# Patient Record
Sex: Male | Born: 1950
Health system: Southern US, Community
[De-identification: ages and names within clinical notes are randomized; demographics above are authoritative.]

## PROBLEM LIST (undated history)

## (undated) DIAGNOSIS — E8881 Metabolic syndrome: Secondary | ICD-10-CM

## (undated) DIAGNOSIS — N400 Enlarged prostate without lower urinary tract symptoms: Secondary | ICD-10-CM

## (undated) DIAGNOSIS — K219 Gastro-esophageal reflux disease without esophagitis: Secondary | ICD-10-CM

## (undated) DIAGNOSIS — E119 Type 2 diabetes mellitus without complications: Secondary | ICD-10-CM

## (undated) DIAGNOSIS — M1990 Unspecified osteoarthritis, unspecified site: Secondary | ICD-10-CM

## (undated) DIAGNOSIS — M48062 Spinal stenosis, lumbar region with neurogenic claudication: Secondary | ICD-10-CM

## (undated) DIAGNOSIS — I10 Essential (primary) hypertension: Secondary | ICD-10-CM

## (undated) DIAGNOSIS — E785 Hyperlipidemia, unspecified: Secondary | ICD-10-CM

## (undated) DIAGNOSIS — Z8719 Personal history of other diseases of the digestive system: Secondary | ICD-10-CM

## (undated) HISTORY — DX: Metabolic syndrome: E88.81

## (undated) HISTORY — PX: MENISCUS REPAIR: SHX5179

## (undated) HISTORY — PX: TONSILLECTOMY: SUR1361

## (undated) HISTORY — DX: Unspecified osteoarthritis, unspecified site: M19.90

## (undated) HISTORY — DX: Type 2 diabetes mellitus without complications: E11.9

## (undated) HISTORY — DX: Gastro-esophageal reflux disease without esophagitis: K21.9

## (undated) HISTORY — DX: Hyperlipidemia, unspecified: E78.5

## (undated) HISTORY — PX: BACK SURGERY: SHX140

## (undated) HISTORY — PX: EYE SURGERY: SHX253

## (undated) HISTORY — DX: Essential (primary) hypertension: I10

## (undated) HISTORY — DX: Metabolic syndrome: E88.810

## (undated) HISTORY — DX: Benign prostatic hyperplasia without lower urinary tract symptoms: N40.0

## (undated) HISTORY — PX: MANDIBLE FRACTURE SURGERY: SHX706

---

## 1998-06-05 ENCOUNTER — Emergency Department (HOSPITAL_COMMUNITY): Admission: EM | Admit: 1998-06-05 | Discharge: 1998-06-05 | Payer: Self-pay | Admitting: Emergency Medicine

## 2002-11-05 ENCOUNTER — Ambulatory Visit (HOSPITAL_COMMUNITY): Admission: RE | Admit: 2002-11-05 | Discharge: 2002-11-05 | Payer: Self-pay | Admitting: Internal Medicine

## 2008-06-30 ENCOUNTER — Ambulatory Visit (HOSPITAL_COMMUNITY): Admission: RE | Admit: 2008-06-30 | Discharge: 2008-06-30 | Payer: Self-pay | Admitting: Family Medicine

## 2008-06-30 ENCOUNTER — Encounter: Payer: Self-pay | Admitting: Orthopedic Surgery

## 2008-07-13 ENCOUNTER — Ambulatory Visit: Payer: Self-pay | Admitting: Orthopedic Surgery

## 2008-07-13 DIAGNOSIS — M25529 Pain in unspecified elbow: Secondary | ICD-10-CM

## 2010-10-09 ENCOUNTER — Ambulatory Visit (HOSPITAL_COMMUNITY): Admission: RE | Admit: 2010-10-09 | Discharge: 2010-10-09 | Payer: Self-pay | Admitting: Family Medicine

## 2011-05-18 NOTE — Consult Note (Signed)
Michael Tapia, Michael Tapia                         ACCOUNT NO.:  [id number]   MEDICAL RECORD NO.:  [id number]                  PATIENT TYPE:   LOCATION:                                       FACILITY:   PHYSICIAN:  R. Roetta Sessions, M.D.              DATE OF BIRTH:  [birth year]   DATE OF CONSULTATION:  10/22/2002  DATE OF DISCHARGE:                                   CONSULTATION   PHYSICIAN REQUESTING CONSULTATION:  Elvina Sidle, M.D.   REASON FOR CONSULTATION:  Colonoscopy.   HISTORY OF PRESENT ILLNESS:  The patient is a pleasant 60 year old gentleman  patient of Dr. Josefine Class who recently saw Dr. Milus Glazier when he developed  acute diarrhea.  He was treated with Cipro and symptoms resolved.  He  presents today primarily for screening colonoscopy.  He denies any recurrent  diarrhea, constipation, melena, or rectal bleeding.  No abdominal pain,  nausea, vomiting, dysphagia, or odynophagia.  He has typical reflux symptoms  which are well controlled on Prilosec.  He has had chronic reflux for more  than five years' duration.   CURRENT MEDICATIONS:  1. Prilosec 20 mg daily.  2. Vioxx 25 mg q.d.  3. Aspirin 325 mg q.d.  4. Vitamin E 400 IU q.d.  5. Garlic pill q.d.  6. Multivitamin q.d.   ALLERGIES:  1. PENICILLIN.  2. IVP DYE.   PAST MEDICAL HISTORY:  Arthritis and chronic gastroesophageal reflux  disease.   PAST SURGICAL HISTORY:  Lumbar disk surgery in 1993, tonsillectomy, left jaw  repair.   FAMILY HISTORY:  Mother has COPD.  No family history of chronic GI illnesses  or colorectal cancer.   SOCIAL HISTORY:  He has been married for 30 years.  He has one child.  He  works with [employer].  He quit smoking in the 1980s.  He occasionally  consumes beer.   REVIEW OF SYSTEMS:  Please see HPI for GI.  GENERAL:  Denies any weight  loss.  CARDIOPULMONARY:  Denies any chest pain or shortness of breath or  heart disease.   PHYSICAL EXAMINATION:  VITAL SIGNS:  Weight  210.5 pounds, height 5 feet 10  inches, temperature 97.3 degrees, blood pressure 130/72, pulse 70.  GENERAL:  A very pleasant, well-nourished, well-developed Caucasian  gentleman in no acute distress.  SKIN:  Warm and dry.  No jaundice.  HEENT:  Pupils are equal, round, and reactive to light.  Conjunctivae are  pink.  Sclerae are nonicteric.  Oropharyngeal mucosa moist and pink.  No  lesions, erythema, or exudate.  No lymphadenopathy, thyromegaly, or carotid  bruits.  CHEST:  Lungs are clear to auscultation.  CARDIAC:  Regular rate and rhythm.  Normal S1, S2.  No murmurs, rubs, or  gallops.  ABDOMEN:  Positive bowel sounds.  Soft, nontender, nondistended.  No  organomegaly or masses  EXTREMITIES:  No edema.   IMPRESSION:  The patient is a pleasant  60 year old gentleman who presents  today primarily for a screening colonoscopy.  He recently had an episode of  diarrhea which has completely resolved with a course of Cipro.  He has never  had a colonoscopy and, given his age, would recommend one primarily for  screening purposes at this time.  He also has chronic gastroesophageal  reflux disease of more than five years' duration.  We discussed  esophagogastroduodenoscopy primarily to screen for complicated  gastroesophageal reflux disease, such as Barrett's esophagus.  Discussed  risks, alternatives, benefits of both esophagogastroduodenoscopy and  colonoscopy with the patient and he is agreeable to proceed.   PLAN:  1. Colonoscopy and esophagogastroduodenoscopy in the near future.  2. Continue Prilosec 20 mg daily.   I would like to thank Dr. Milus Glazier for allowing [place] to take part in the  care of this patient.      Tana Coast, P.AJonathon Bellows, M.D.    LL/MEDQ  D:  10/23/2002  T:  10/23/2002  Job:  [id number]   cc:   Elvina Sidle, M.D.  [address] [place]  [place] [address]  Fax: [phone]   Hanley Hays. Dechurch, M.D.  [address]  [place]  [place] [address]  Fax: [phone]

## 2011-05-18 NOTE — Op Note (Signed)
NAME:  Michael Tapia, Michael Tapia                         ACCOUNT NO.:  [id number]   MEDICAL RECORD NO.:  [id number]                   PATIENT TYPE:  AMB   LOCATION:  DAY                                  FACILITY:  [facility]   PHYSICIAN:  R. Roetta Sessions, M.D.              DATE OF BIRTH:  Feb 04, 1951   DATE OF PROCEDURE:  11/05/2002  DATE OF DISCHARGE:                                 OPERATIVE REPORT   PROCEDURE:  Diagnostic esophagogastroduodenoscopy followed by a screening  colonoscopy.   INDICATIONS FOR PROCEDURE:  The patient is a 60 year old gentleman recently  with a bout of diarrhea which was resolved with a course of Cipro. He has  longstanding gastroesophageal reflux symptoms well controlled with Prilosec  without any other associated symptomatology. EGD and colonoscopy are now  being done. This approach has been discussed with Michael Tapia previously.  The potential risks, benefits, and alternatives have been reviewed,  questions answered. ASA II. Please see my dictated consultation note for  more information.   MONITORING:  O2 saturation, blood pressure, pulse, and respirations were  monitored throughout the entire procedure.   CONSCIOUS SEDATION:  Versed 4 mg IV, Demerol 100 mg IV in divided doses.   INSTRUMENTS:  Olympus gastroscope and colonoscope.   EGD FINDINGS:  Examination of the tubular esophagus revealed a couple of  tiny distal esophageal erosions, Barrett's esophagus or other abnormalities.  The EG junction was easily traversed.   STOMACH:  The gastric cavity was empty and insufflated well with air. A  thorough examination of the gastric mucosa including a retroflexed view of  the proximal stomach and esophagogastric junction demonstrated no  abnormality. The pylorus was patent and easily traversed.   DUODENUM:  The bulb and second portion appeared normal.   THERAPEUTIC/DIAGNOSTIC MANEUVERS:  None.   The patient tolerated the procedure well and was prepared for  colonoscopy.  Digital rectal exam revealed no abnormalities.   ENDOSCOPIC FINDINGS:  The prep was good.   RECTUM:  Examination of the rectal mucosa including a retroflexed view of  the anal verge revealed no abnormalities.   COLON:  The colonic mucosa was surveyed from the rectosigmoid junction  through the left transverse right colon to the area of the appendiceal  orifice, ileocecal valve and cecum. These structures were seen and  photographed for the record.  The colonic mucosa appeared normal upon  advanced except for the cecum. The terminal ileum was intubated at 10 cm.  The GI tract appeared normal. The cecum, ileocecal valve, and appendiceal  orifice were photographed for the record. From this level, the scope was  slowly and cautiously withdrawn.  All previously mentioned mucosal surfaces  were again seen. At the splenic flexure was a 3 mm diminutive polyp which  was cold biopsied/removed. The remainder of the colonic mucosa appeared  normal. The patient tolerated both procedures and was reacted in endoscopy.   IMPRESSION:  1.  EGD. A couple of tiny distal esophageal erosions consistent with mild     erosive reflux esophagitis.  2. Normal stomach, normal D1, D2.  3. Colonoscopy findings.  Normal rectum. Diminutive polyp of the splenic     flexure, cold biopsied/removed. The remainder of the colonic mucosa of     the terminal ileum appeared normal.   RECOMMENDATIONS:  1. GERD literature provided to Michael Tapia.  2. Continue Prilosec 12 mg orally daily.  3. Follow-up on path.  4. Further recommendations to follow.                                               Jonathon Bellows, M.D.    RMR/MEDQ  D:  11/05/2002  T:  11/05/2002  Job:  [id number]   cc:   Elvina Sidle, M.D.  [address] [place]  [place] [address]  Fax: [phone]   Hanley Hays. Dechurch, M.D.  [address]  [place]  [place] [address]  Fax: [phone]

## 2013-03-14 ENCOUNTER — Encounter: Payer: Self-pay | Admitting: Family Medicine

## 2013-03-14 DIAGNOSIS — E785 Hyperlipidemia, unspecified: Secondary | ICD-10-CM | POA: Insufficient documentation

## 2013-03-14 DIAGNOSIS — I10 Essential (primary) hypertension: Secondary | ICD-10-CM

## 2013-03-14 DIAGNOSIS — E8881 Metabolic syndrome: Secondary | ICD-10-CM | POA: Insufficient documentation

## 2013-03-14 DIAGNOSIS — K219 Gastro-esophageal reflux disease without esophagitis: Secondary | ICD-10-CM | POA: Insufficient documentation

## 2013-03-14 DIAGNOSIS — IMO0002 Reserved for concepts with insufficient information to code with codable children: Secondary | ICD-10-CM

## 2013-03-14 DIAGNOSIS — M170 Bilateral primary osteoarthritis of knee: Secondary | ICD-10-CM

## 2013-03-24 ENCOUNTER — Encounter: Payer: Self-pay | Admitting: Family Medicine

## 2013-03-24 ENCOUNTER — Ambulatory Visit (INDEPENDENT_AMBULATORY_CARE_PROVIDER_SITE_OTHER): Payer: BC Managed Care – PPO | Admitting: Family Medicine

## 2013-03-24 VITALS — BP 115/71 | HR 67 | Temp 97.5°F | Ht 70.25 in | Wt 223.2 lb

## 2013-03-24 DIAGNOSIS — IMO0002 Reserved for concepts with insufficient information to code with codable children: Secondary | ICD-10-CM

## 2013-03-24 DIAGNOSIS — I10 Essential (primary) hypertension: Secondary | ICD-10-CM

## 2013-03-24 DIAGNOSIS — E8881 Metabolic syndrome: Secondary | ICD-10-CM

## 2013-03-24 DIAGNOSIS — E1165 Type 2 diabetes mellitus with hyperglycemia: Secondary | ICD-10-CM

## 2013-03-24 DIAGNOSIS — E118 Type 2 diabetes mellitus with unspecified complications: Secondary | ICD-10-CM

## 2013-03-24 NOTE — Progress Notes (Signed)
Subjective:     Patient ID: Michael Tapia, male   DOB: December 31, 1951, 62 y.o.   MRN: [id number]  HPI Patient is here for followup on his hypertension. His blood pressure was elevated last time he was seen on 02/09/2013. He has lost 16 pounds and his blood pressure much improved and normal. No headache chest pain or shortness of breath. No pedal edema. With the weight loss through dietary changes and exercising on a treadmill, even his diabetes has gotten better and his blood sugar went down to the 60s and he had to reduce his diabetes medications significantly. He has seen Marcelino Duster for diabetes education. He has made significant lifestyle therapeutic changes. And he is now seen the benefits of these.  Past Medical History  Diagnosis Date  . Hyperlipidemia   . Hypertension   . Diabetes mellitus without complication   . Arthritis     knees  . GERD (gastroesophageal reflux disease)   . Metabolic syndrome    No past surgical history on file. History   Social History  . Marital Status: Married    Spouse Name: N/A    Number of Children: N/A  . Years of Education: N/A   Occupational History  .      retired   Social History Main Topics  . Smoking status: Former Smoker    Quit date: 03/15/1979  . Smokeless tobacco: Not on file  . Alcohol Use: No  . Drug Use: No  . Sexually Active: Not on file   Other Topics Concern  . Not on file   Social History Narrative   married   No family history on file. Current Outpatient Prescriptions on File Prior to Visit  Medication Sig Dispense Refill  . aspirin 81 MG tablet Take 81 mg by mouth daily.      Marland Kitchen atorvastatin (LIPITOR) 10 MG tablet Take 10 mg by mouth daily.      . Coenzyme Q10 10 MG capsule Take 10 mg by mouth daily.      . Dutasteride-Tamsulosin HCl (JALYN) 0.5-0.4 MG CAPS Take 1 tablet by mouth daily.      Marland Kitchen lisinopril (PRINIVIL,ZESTRIL) 20 MG tablet Take 20 mg by mouth daily.      Marland Kitchen omeprazole (PRILOSEC) 20 MG capsule Take 20 mg  by mouth daily.      Marland Kitchen VITAMIN D, ERGOCALCIFEROL, PO Take 1 tablet by mouth daily.      . sitaGLIPtan-metformin (JANUMET) 50-1000 MG per tablet Take 0.5 tablets by mouth once. 1/2 tablet once a day       No current facility-administered medications on file prior to visit.   Allergies  Allergen Reactions  . Bee Venom   . Ivp Dye (Iodinated Diagnostic Agents)   . Penicillins    Immunization History  Administered Date(s) Administered  . Influenza Whole 11/14/2012  . Pneumococcal Polysaccharide 04/07/2012  . Td 08/14/2012   Prior to Admission medications   Medication Sig Start Date End Date Taking? Authorizing Provider  aspirin 81 MG tablet Take 81 mg by mouth daily.   Yes Historical Provider, MD  atorvastatin (LIPITOR) 10 MG tablet Take 10 mg by mouth daily.   Yes Historical Provider, MD  Coenzyme Q10 10 MG capsule Take 10 mg by mouth daily.   Yes Historical Provider, MD  Dutasteride-Tamsulosin HCl (JALYN) 0.5-0.4 MG CAPS Take 1 tablet by mouth daily.   Yes Historical Provider, MD  lisinopril (PRINIVIL,ZESTRIL) 20 MG tablet Take 20 mg by mouth daily.   Yes Historical Provider,  MD  omeprazole (PRILOSEC) 20 MG capsule Take 20 mg by mouth daily.   Yes Historical Provider, MD  ONE TOUCH ULTRA TEST test strip  01/05/13  Yes Historical Provider, MD  Dola Argyle LANCETS 33G MISC  01/02/13  Yes Historical Provider, MD  VITAMIN D, ERGOCALCIFEROL, PO Take 1 tablet by mouth daily.   Yes Historical Provider, MD  sitaGLIPtan-metformin (JANUMET) 50-1000 MG per tablet Take 0.5 tablets by mouth once. 1/2 tablet once a day    Historical Provider, MD     Review of Systems No new complaints or symptoms.    Objective:   Physical Exam On examination he appeared in no acute distress. Overweight Vital signs as documented. BP 115/71  Pulse 67  Temp(Src) 97.5 F (36.4 C) (Oral)  Ht 5' 10.25" (1.784 m)  Wt 223 lb 3.2 oz (101.243 kg)  BMI 31.81 kg/m2  Skin warm and dry and without overt rashes.   Head &Neck without JVD. Normal. Lungs clear.  Heart exam notable for regular rhythm, normal sounds and absence of murmurs, rubs or gallops.  Abdomen unremarkable and without evidence of organomegaly, masses, or abdominal aortic enlargement.  Extremities nonedematous. Neurologic: oriented to name, place, and time. Nonfocal exam.    Assessment:     Unspecified essential hypertension  Type II or unspecified type diabetes mellitus with unspecified complication, uncontrolled  Metabolic syndrome      Plan:     Discussed with patient and reviewed his successful weight loss and dietary changes and lifestyle changes. He is encouraged to continue with the lifestyle therapeutic changes. He is happy about the results. And he promises to continue. Reviewed his medications with him. He will return to the clinic in 3 months for followup and laboratory blood work as well.  Malacai Grantz P. Modesto Charon, M.D.

## 2013-03-25 ENCOUNTER — Other Ambulatory Visit: Payer: Self-pay | Admitting: *Deleted

## 2013-03-25 DIAGNOSIS — E1110 Type 2 diabetes mellitus with ketoacidosis without coma: Secondary | ICD-10-CM

## 2013-03-25 MED ORDER — ONETOUCH DELICA LANCETS 33G MISC: 1.0000 | Freq: Every morning | Status: DC

## 2013-03-26 ENCOUNTER — Other Ambulatory Visit: Payer: Self-pay

## 2013-03-26 DIAGNOSIS — E1110 Type 2 diabetes mellitus with ketoacidosis without coma: Secondary | ICD-10-CM

## 2013-03-26 MED ORDER — ONETOUCH DELICA LANCETS 33G MISC: 1.0000 | Freq: Every morning | Status: DC

## 2013-03-30 ENCOUNTER — Other Ambulatory Visit: Payer: Self-pay | Admitting: Family Medicine

## 2013-03-30 NOTE — Telephone Encounter (Signed)
NEEDS MAIL ORDER WRITTEN FOR ATORVASTATIN AND ONE TOUCH DELICA LANCETS. PRINT AND HAVE NURSE CALL PT

## 2013-04-01 ENCOUNTER — Other Ambulatory Visit: Payer: Self-pay | Admitting: Family Medicine

## 2013-04-01 ENCOUNTER — Telehealth: Payer: Self-pay | Admitting: *Deleted

## 2013-04-01 DIAGNOSIS — E1110 Type 2 diabetes mellitus with ketoacidosis without coma: Secondary | ICD-10-CM

## 2013-04-01 MED ORDER — ONETOUCH DELICA LANCETS 33G MISC: 1.0000 | Freq: Every morning | Status: DC

## 2013-04-01 MED ORDER — GLUCOSE BLOOD VI STRP: ORAL_STRIP | Status: DC

## 2013-04-01 MED ORDER — ATORVASTATIN CALCIUM 20 MG PO TABS: 20.0000 mg | ORAL_TABLET | Freq: Every day | ORAL | Status: DC

## 2013-04-01 MED ORDER — ATORVASTATIN CALCIUM 10 MG PO TABS: 20.0000 mg | ORAL_TABLET | Freq: Every day | ORAL | Status: DC

## 2013-04-01 NOTE — Telephone Encounter (Signed)
Pts rxs up front. Patient notified

## 2013-04-01 NOTE — Telephone Encounter (Signed)
Patient also need written rx for 90 day supply for lancets. Checking tid

## 2013-04-01 NOTE — Telephone Encounter (Signed)
Patient notified that rx up front ready to pick up 

## 2013-04-01 NOTE — Telephone Encounter (Signed)
Prescription written for patient 4 9 today's supplies with 3 refills of atorvastatin was increased from 10-20 mg.

## 2013-04-15 ENCOUNTER — Telehealth: Payer: Self-pay | Admitting: Family Medicine

## 2013-04-16 NOTE — Telephone Encounter (Signed)
[facility] didn't like the way the prescription he sent in was written.  It was written for Lipitor 10mg  take 2 tablets daily.  He has a script for Lipitor 20mg  that he is going to send in.

## 2013-04-22 ENCOUNTER — Encounter: Payer: Self-pay | Admitting: *Deleted

## 2013-05-22 ENCOUNTER — Ambulatory Visit (INDEPENDENT_AMBULATORY_CARE_PROVIDER_SITE_OTHER): Payer: BC Managed Care – PPO | Admitting: Physician Assistant

## 2013-05-22 ENCOUNTER — Encounter: Payer: Self-pay | Admitting: Physician Assistant

## 2013-05-22 ENCOUNTER — Telehealth: Payer: Self-pay | Admitting: Family Medicine

## 2013-05-22 VITALS — BP 117/70 | HR 73 | Temp 99.3°F | Ht 70.5 in | Wt 218.8 lb

## 2013-05-22 DIAGNOSIS — W57XXXA Bitten or stung by nonvenomous insect and other nonvenomous arthropods, initial encounter: Secondary | ICD-10-CM

## 2013-05-22 NOTE — Patient Instructions (Signed)
Wood Tick Bite Ticks are insects that attach themselves to the skin. Most tick bites are harmless, but sometimes ticks carry diseases that can make a person quite ill. The chance of getting ill depends on:  The kind of tick that bites you.  Time of year.  How long the tick is attached.  Geographic location. Wood ticks are also called dog ticks. They are generally black. They can have white markings. They live in shrubs and grassy areas. They are larger than deer ticks. Wood ticks are about the size of a watermelon seed. They have a hard body. The most common places for ticks to attach themselves are the scalp, neck, armpits, waist, and groin. Wood ticks may stay attached for up to 2 weeks. TICKS MUST BE REMOVED AS SOON AS POSSIBLE TO HELP PREVENT DISEASES CAUSED BY TICK BITES.  TO REMOVE A TICK: 1. If available, put on latex gloves before trying to remove a tick. 2. Grasp the tick as close to the skin as possible, with curved forceps, fine tweezers or a special tick removal tool. 3. Pull gently with steady pressure until the tick lets go. Do not twist the tick or jerk it suddenly. This may break off the tick's head or mouth parts. 4. Do not crush the tick's body. This could force disease-carrying fluids from the tick into your body. 5. After the tick is removed, wash the bite area and your hands with soap and water or other disinfectant. 6. Apply a small amount of antiseptic cream or ointment to the bite site. 7. Wash and disinfect any instruments that were used. 8. Save the tick in a jar or plastic bag for later identification. Preserve the tick with a bit of alcohol or put it in the freezer. 9. Do not apply a hot match, petroleum jelly, or fingernail polish to the tick. This does not work and may increase the chances of disease from the tick bite. YOU MAY NEED TO SEE YOUR CAREGIVER FOR A TETANUS SHOT NOW IF:  You have no idea when you had the last one.  You have never had a tetanus shot  before. If you need a tetanus shot, and you decide not to get one, there is a rare chance of getting tetanus. Sickness from tetanus can be serious. If you get a tetanus shot, your arm may swell, get red and warm to the touch at the shot site. This is common and not a problem. PREVENTION  Wear protective clothing. Long sleeves and pants are best.  Wear white clothes to see ticks more easily  Tuck your pant legs into your socks.  If walking on trail, stay in the middle of the trail to avoid brushing against bushes.  Put insect repellent on all exposed skin and along boot tops, pant legs and sleeve cuffs  Check clothing, hair and skin repeatedly and before coming inside.  Brush off any ticks that are not attached. SEEK MEDICAL CARE IF:   You cannot remove a tick or part of the tick that is left in the skin.  Unexplained fever.  Redness and swelling in the area of the tick bite.  Tender, swollen lymph glands.  Diarrhea.  Weight loss.  Cough.  Fatigue.  Muscle, joint or bone pain.  Belly pain.  Headache.  Rash. SEEK IMMEDIATE MEDICAL CARE IF:   You develop an oral temperature above 102 F (38.9 C).  You are having trouble walking or moving your legs.  Numbness in the legs.    pain.   Belly pain.   Headache.   Rash.  SEEK IMMEDIATE MEDICAL CARE IF:    You develop an oral temperature above 102 F (38.9 C).   You are having trouble walking or moving your legs.   Numbness in the legs.   Shortness of breath.   Confusion.   Repeated vomiting.  Document Released: 12/14/2000 Document Revised: 03/10/2012 Document Reviewed: 11/22/2008  [employer] Patient Information 2014 [employer]

## 2013-05-22 NOTE — Progress Notes (Signed)
Subjective:     Patient ID: Michael Tapia, male   DOB: Dec 25, 1951, 62 y.o.   MRN: [id number]  HPI Pt with mult tick bites to the body He was fishing around a pond and returned to find he was infested with ticks He removed all by taking Clorox bath Pt with some pruritus to the areas No pain or drainage from sites  Review of Systems  All other systems reviewed and are negative.       Objective:   Physical Exam  Nursing note and vitals reviewed.  Mult erythem bites to the prox arms bilat  Similar areas to the prox legs and groin No ing nodes No surrounding induration or erythema to the bite sites No TTP    Assessment:     Tick Bite     Plan:     OTC antihist Cool compresses F/U prn

## 2013-05-22 NOTE — Telephone Encounter (Signed)
appt given  

## 2013-06-16 ENCOUNTER — Other Ambulatory Visit: Payer: BC Managed Care – PPO

## 2013-06-16 ENCOUNTER — Encounter: Payer: Self-pay | Admitting: Family Medicine

## 2013-06-16 ENCOUNTER — Ambulatory Visit (INDEPENDENT_AMBULATORY_CARE_PROVIDER_SITE_OTHER): Payer: BC Managed Care – PPO | Admitting: Family Medicine

## 2013-06-16 VITALS — BP 117/68 | HR 68 | Temp 97.1°F | Wt 217.2 lb

## 2013-06-16 DIAGNOSIS — E559 Vitamin D deficiency, unspecified: Secondary | ICD-10-CM

## 2013-06-16 DIAGNOSIS — E785 Hyperlipidemia, unspecified: Secondary | ICD-10-CM

## 2013-06-16 DIAGNOSIS — K219 Gastro-esophageal reflux disease without esophagitis: Secondary | ICD-10-CM

## 2013-06-16 DIAGNOSIS — E119 Type 2 diabetes mellitus without complications: Secondary | ICD-10-CM

## 2013-06-16 DIAGNOSIS — E8881 Metabolic syndrome: Secondary | ICD-10-CM

## 2013-06-16 DIAGNOSIS — I10 Essential (primary) hypertension: Secondary | ICD-10-CM

## 2013-06-16 LAB — POCT GLYCOSYLATED HEMOGLOBIN (HGB A1C): Hemoglobin A1C: 5

## 2013-06-16 LAB — POCT UA - MICROALBUMIN: Microalbumin Ur, POC: POSITIVE mg/L

## 2013-06-16 NOTE — Progress Notes (Signed)
Patient ID: Michael Tapia, male   DOB: 17-Sep-1951, 62 y.o.   MRN: [id number] SUBJECTIVE: CC: Chief Complaint  Patient presents with  . Follow-up    3 month follow up chronic problems    HPI: Patient is here for follow up of hyperlipidemia:  denies Headache;denies Chest Pain;denies weakness;denies Shortness of Breath and orthopnea;denies Visual changes;denies palpitations;denies cough;denies pedal edema;denies symptoms of TIA or stroke;deniesClaudication symptoms. admits to Compliance with medications; denies Problems with medications.  Patient is here for follow up of Diabetes Mellitus:108 this am usually 100 Symptoms of DM: Denies Nocturia ,Denies Urinary Frequency , denies Blurred vision ,deniesDizziness,denies.Dysuria,denies paresthesias, denies extremity pain or ulcers.Marland Kitchendenies chest pain. has had an annual eye exam. do check the feet. Does check CBGs. Average CBG: Denies episodes of hypoglycemia. Does have an emergency hypoglycemic plan. admits toCompliance with medications. Denies Problems with medications.   Left knee went out and is  A problem for  2 months now. Walked a 1/2 mile on threadmill today.  PMH/PSH: reviewed/updated in Epic  SH/FH: reviewed/updated in Epic  Allergies: reviewed/updated in Epic  Medications: reviewed/updated in Epic  Immunizations: reviewed/updated in Epic  ROS: As above in the HPI. All other systems are stable or negative.  OBJECTIVE: APPEARANCE:  Patient in no acute distress.The patient appeared well nourished and normally developed. Acyanotic. Waist: VITAL SIGNS:BP 117/68  Pulse 68  Temp(Src) 97.1 F (36.2 C) (Oral)  Wt 217 lb 3.2 oz (98.521 kg)  BMI 30.71 kg/m2 WM looks well  SKIN: warm and  Dry without overt rashes, tattoos and scars  HEAD and Neck: without JVD, Head and scalp: normal Eyes:No scleral icterus. Fundi normal, eye movements normal. Ears: Auricle normal, canal normal, Tympanic membranes normal, insufflation  normal. Nose: normal Throat: normal Neck & thyroid: normal  CHEST & LUNGS: Chest wall: normal Lungs: Clear  CVS: Reveals the PMI to be normally located. Regular rhythm, First and Second Heart sounds are normal,  absence of murmurs, rubs or gallops. Peripheral vasculature: Radial pulses: normal Dorsal pedis pulses: normal Posterior pulses: normal  ABDOMEN:  Appearance: normal Benign, no organomegaly, no masses, no Abdominal Aortic enlargement. No Guarding , no rebound. No Bruits. Bowel sounds: normal  RECTAL: N/A GU: N/A  EXTREMETIES: nonedematous. Both Femoral and Pedal pulses are normal.  MUSCULOSKELETAL:  Spine: normal Joints:knees crepitus  NEUROLOGIC: oriented to time,place and person; nonfocal. Strength is normal Sensory is normal Reflexes are normal Cranial Nerves are normal.  ASSESSMENT: DM (diabetes mellitus) - Plan: POCT UA - Microalbumin, POCT glycosylated hemoglobin (Hb A1C), COMPLETE METABOLIC PANEL WITH GFR, Microalbumin, urine  HTN (hypertension) - Plan: COMPLETE METABOLIC PANEL WITH GFR  HLD (hyperlipidemia) - Plan: COMPLETE METABOLIC PANEL WITH GFR, NMR Lipoprofile with Lipids  Metabolic syndrome - Plan: COMPLETE METABOLIC PANEL WITH GFR  GERD (gastroesophageal reflux disease)  Unspecified vitamin D deficiency - Plan: Vitamin D 25 hydroxy  Doing better with dietary changes.  PLAN: Orders Placed This Encounter  Procedures  . COMPLETE METABOLIC PANEL WITH GFR  . NMR Lipoprofile with Lipids  . Vitamin D 25 hydroxy  . Microalbumin, urine  . POCT UA - Microalbumin  . POCT glycosylated hemoglobin (Hb A1C)   Results for orders placed in visit on 06/16/13  POCT UA - MICROALBUMIN      Result Value Range   Microalbumin Ur, POC positive    POCT GLYCOSYLATED HEMOGLOBIN (HGB A1C)      Result Value Range   Hemoglobin A1C 5.0%     No orders of the defined types  were placed in this encounter.   Return in about 4 months (around 10/16/2013) for  Recheck medical problems.  Ardyce Heyer P. Modesto Charon, M.D.

## 2013-06-17 LAB — COMPLETE METABOLIC PANEL WITH GFR
ALT: 21 U/L (ref 0–53)
AST: 20 U/L (ref 0–37)
Albumin: 4.4 g/dL (ref 3.5–5.2)
Alkaline Phosphatase: 45 U/L (ref 39–117)
BUN: 16 mg/dL (ref 6–23)
CO2: 26 mEq/L (ref 19–32)
Calcium: 9.8 mg/dL (ref 8.4–10.5)
Chloride: 104 mEq/L (ref 96–112)
Creat: 0.91 mg/dL (ref 0.50–1.35)
GFR, Est African American: >89 mL/min
GFR, Est Non African American: 89 mL/min
Glucose, Bld: 93 mg/dL (ref 70–99)
Potassium: 4.8 mEq/L (ref 3.5–5.3)
Sodium: 138 mEq/L (ref 135–145)
Total Bilirubin: 0.8 mg/dL (ref 0.3–1.2)
Total Protein: 6.6 g/dL (ref 6.0–8.3)

## 2013-06-17 LAB — NMR LIPOPROFILE WITH LIPIDS
Cholesterol, Total: 143 mg/dL (ref <200)
HDL Particle Number: 30.7 umol/L (ref >=30.5)
HDL Size: 8.7 nm — ABNORMAL LOW (ref >=9.2)
HDL-C: 41 mg/dL (ref >=40)
LDL (calc): 80 mg/dL (ref <100)
LDL Particle Number: 1198 nmol/L — ABNORMAL HIGH (ref <1000)
LDL Size: 20.4 nm — ABNORMAL LOW (ref >20.5)
LP-IR Score: 44 (ref <=45)
Large HDL-P: 2.6 umol/L — ABNORMAL LOW (ref >=4.8)
Large VLDL-P: <0.8 nmol/L (ref <=2.7)
Small LDL Particle Number: 634 nmol/L — ABNORMAL HIGH (ref <=527)
Triglycerides: 110 mg/dL (ref <150)
VLDL Size: 43.2 nm (ref <=46.6)

## 2013-06-17 LAB — MICROALBUMIN, URINE: Microalb, Ur: 0.5 mg/dL (ref 0.00–1.89)

## 2013-06-17 LAB — VITAMIN D 25 HYDROXY (VIT D DEFICIENCY, FRACTURES): Vit D, 25-Hydroxy: 71 ng/mL (ref 30–89)

## 2013-06-19 ENCOUNTER — Other Ambulatory Visit: Payer: Self-pay

## 2013-06-19 MED ORDER — DUTASTERIDE-TAMSULOSIN HCL 0.5-0.4 MG PO CAPS: 1.0000 | ORAL_CAPSULE | Freq: Every day | ORAL | Status: DC

## 2013-06-19 NOTE — Progress Notes (Signed)
Quick Note:  Lab result at goal.or close to goal. I would like [place] to attain the LDLc at 70 with diet and exercise No change in Medications for now. No Change in plans and follow up. ______

## 2013-06-24 ENCOUNTER — Telehealth: Payer: Self-pay | Admitting: Family Medicine

## 2013-06-25 ENCOUNTER — Other Ambulatory Visit: Payer: Self-pay

## 2013-06-25 ENCOUNTER — Ambulatory Visit: Payer: BC Managed Care – PPO | Admitting: Family Medicine

## 2013-06-25 NOTE — Telephone Encounter (Signed)
Done 06/19/13, pt aware

## 2013-08-27 ENCOUNTER — Other Ambulatory Visit: Payer: Self-pay | Admitting: Nurse Practitioner

## 2013-08-27 ENCOUNTER — Other Ambulatory Visit: Payer: Self-pay | Admitting: Family Medicine

## 2013-10-16 ENCOUNTER — Encounter: Payer: Self-pay | Admitting: Family Medicine

## 2013-10-16 ENCOUNTER — Ambulatory Visit (INDEPENDENT_AMBULATORY_CARE_PROVIDER_SITE_OTHER): Payer: BC Managed Care – PPO | Admitting: Family Medicine

## 2013-10-16 ENCOUNTER — Encounter (INDEPENDENT_AMBULATORY_CARE_PROVIDER_SITE_OTHER): Payer: Self-pay

## 2013-10-16 VITALS — BP 149/83 | HR 69 | Temp 97.7°F | Ht 70.25 in | Wt 219.8 lb

## 2013-10-16 DIAGNOSIS — I10 Essential (primary) hypertension: Secondary | ICD-10-CM

## 2013-10-16 DIAGNOSIS — M1710 Unilateral primary osteoarthritis, unspecified knee: Secondary | ICD-10-CM

## 2013-10-16 DIAGNOSIS — M170 Bilateral primary osteoarthritis of knee: Secondary | ICD-10-CM

## 2013-10-16 DIAGNOSIS — E785 Hyperlipidemia, unspecified: Secondary | ICD-10-CM

## 2013-10-16 DIAGNOSIS — IMO0002 Reserved for concepts with insufficient information to code with codable children: Secondary | ICD-10-CM

## 2013-10-16 DIAGNOSIS — E8881 Metabolic syndrome: Secondary | ICD-10-CM

## 2013-10-16 DIAGNOSIS — K219 Gastro-esophageal reflux disease without esophagitis: Secondary | ICD-10-CM

## 2013-10-16 DIAGNOSIS — Z23 Encounter for immunization: Secondary | ICD-10-CM | POA: Insufficient documentation

## 2013-10-16 DIAGNOSIS — E1165 Type 2 diabetes mellitus with hyperglycemia: Secondary | ICD-10-CM

## 2013-10-16 LAB — POCT GLYCOSYLATED HEMOGLOBIN (HGB A1C): Hemoglobin A1C: 5.2

## 2013-10-16 LAB — POCT UA - MICROALBUMIN: Microalbumin Ur, POC: POSITIVE mg/L

## 2013-10-16 NOTE — Progress Notes (Signed)
SUBJECTIVE: CC: Chief Complaint  Patient presents with  . Follow-up    4 month     HPI: Patient is here for follow up of Diabetes Mellitus: Symptoms evaluated: Denies Nocturia ,Denies Urinary Frequency , denies Blurred vision ,deniesDizziness,denies.Dysuria,denies paresthesias, denies extremity pain or ulcers.Michael Tapia chest pain. has had an annual eye exam. do check the feet. Does check CBGs. Average CBG:104 Denies episodes of hypoglycemia. Does have an emergency hypoglycemic plan. Compliance with medications.atorvastatin caused muscle aches.so he stopped it. Muscles better again and able to walk and lose weight.  Past Medical History  Diagnosis Date  . Hyperlipidemia   . Hypertension   . Diabetes mellitus without complication   . Arthritis     knees  . GERD (gastroesophageal reflux disease)   . Metabolic syndrome   . BPH (benign prostatic hyperplasia)    No past surgical history on file. History   Social History  . Marital Status: Married    Spouse Name: N/A    Number of Children: N/A  . Years of Education: N/A   Occupational History  .      retired   Social History Main Topics  . Smoking status: Former Smoker    Quit date: 03/15/1979  . Smokeless tobacco: Not on file  . Alcohol Use: No  . Drug Use: No  . Sexual Activity: Not on file   Other Topics Concern  . Not on file   Social History Narrative   married   No family history on file. Current Outpatient Prescriptions on File Prior to Visit  Medication Sig Dispense Refill  . aspirin 81 MG tablet Take 81 mg by mouth daily.      . Coenzyme Q10 10 MG capsule Take 10 mg by mouth daily.      Michael Kitchen glucose blood (ONE TOUCH ULTRA TEST) test strip Use daily  100 each  3  . JALYN 0.5-0.4 MG CAPS TAKE 1 CAPSULE DAILY  90 capsule  0  . JANUMET 50-1000 MG per tablet TAKE 1 TABLET TWICE A DAY  180 tablet  0  . omeprazole (PRILOSEC) 20 MG capsule Take 20 mg by mouth daily.      [name] DELICA LANCETS 33G MISC 1 each  by Does not apply route every morning.  100 each  3  . VITAMIN D, ERGOCALCIFEROL, PO Take 1 tablet by mouth daily.      Michael Kitchen atorvastatin (LIPITOR) 20 MG tablet Take 1 tablet (20 mg total) by mouth daily.  90 tablet  3  . lisinopril (PRINIVIL,ZESTRIL) 20 MG tablet Take 20 mg by mouth daily.       No current facility-administered medications on file prior to visit.   Allergies  Allergen Reactions  . Bee Venom   . Ivp Dye [Iodinated Diagnostic Agents]   . Penicillins    Immunization History  Administered Date(s) Administered  . Influenza Whole 11/14/2012  . Influenza,inj,Quad PF,36+ Mos 10/16/2013  . Pneumococcal Polysaccharide 04/07/2012  . Td 08/14/2012   Prior to Admission medications   Medication Sig Start Date End Date Taking? Authorizing Provider  aspirin 81 MG tablet Take 81 mg by mouth daily.   Yes Historical Provider, MD  Coenzyme Q10 10 MG capsule Take 10 mg by mouth daily.   Yes Historical Provider, MD  glucose blood (ONE TOUCH ULTRA TEST) test strip Use daily 04/01/13  Yes Ileana Ladd, MD  JALYN 0.5-0.4 MG CAPS TAKE 1 CAPSULE DAILY 08/27/13  Yes Ileana Ladd, MD  JANUMET 50-1000 MG  per tablet TAKE 1 TABLET TWICE A DAY 08/27/13  Yes Ileana Ladd, MD  omeprazole (PRILOSEC) 20 MG capsule Take 20 mg by mouth daily.   Yes Historical Provider, MD  [facility] DELICA LANCETS 33G MISC 1 each by Does not apply route every morning. 04/01/13  Yes Ileana Ladd, MD  VITAMIN D, ERGOCALCIFEROL, PO Take 1 tablet by mouth daily.   Yes Historical Provider, MD  atorvastatin (LIPITOR) 20 MG tablet Take 1 tablet (20 mg total) by mouth daily. 04/01/13   Ileana Ladd, MD  lisinopril (PRINIVIL,ZESTRIL) 20 MG tablet Take 20 mg by mouth daily.    Historical Provider, MD     ROS: As above in the HPI. All other systems are stable or negative.  OBJECTIVE: APPEARANCE:  Patient in no acute distress.The patient appeared well nourished and normally developed. Acyanotic. Waist: VITAL SIGNS:BP 149/83   Pulse 69  Temp(Src) 97.7 F (36.5 C) (Oral)  Ht 5' 10.25" (1.784 m)  Wt 219 lb 12.8 oz (99.701 kg)  BMI 31.33 kg/m2 WM Bp 120/75  SKIN: warm and  Dry without overt rashes, tattoos and scars  HEAD and Neck: without JVD, Head and scalp: normal Eyes:No scleral icterus. Fundi normal, eye movements normal. Ears: Auricle normal, canal normal, Tympanic membranes normal, insufflation normal. Nose: normal Throat: normal Neck & thyroid: normal  CHEST & LUNGS: Chest wall: normal Lungs: Clear  CVS: Reveals the PMI to be normally located. Regular rhythm, First and Second Heart sounds are normal,  absence of murmurs, rubs or gallops. Peripheral vasculature: Radial pulses: normal Dorsal pedis pulses: normal Posterior pulses: normal  ABDOMEN:  Appearance: normal Benign, no organomegaly, no masses, no Abdominal Aortic enlargement. No Guarding , no rebound. No Bruits. Bowel sounds: normal  RECTAL: N/A GU: N/A  EXTREMETIES: nonedematous.  MUSCULOSKELETAL:  Spine: normal Joints: intact  NEUROLOGIC: oriented to time,place and person; nonfocal. Strength is normal Sensory is normal Reflexes are normal Cranial Nerves are normal.  ASSESSMENT: Metabolic syndrome - Plan: Vit D  25 hydroxy (rtn osteoporosis monitoring)  Type II or unspecified type diabetes mellitus with unspecified complication, uncontrolled - Plan: POCT glycosylated hemoglobin (Hb A1C), POCT UA - Microalbumin, CMP14+EGFR, Vit D  25 hydroxy (rtn osteoporosis monitoring), Microalbumin, urine  Need for prophylactic vaccination and inoculation against influenza  Other and unspecified hyperlipidemia - Plan: CMP14+EGFR, NMR, lipoprofile  Unspecified essential hypertension - Plan: CMP14+EGFR  GERD (gastroesophageal reflux disease)  Osteoarthritis of both knees  PLAN:      Dr Woodroe Mode Recommendations  For nutrition information, I recommend books:  1).Eat to Live by Dr Monico Hoar. 2).Prevent and  Reverse Heart Disease by Dr Suzzette Righter. 3) Dr Katherina Right Book:  Program to Reverse Diabetes  Exercise recommendations are:  If unable to walk, then the patient can exercise in a chair 3 times a day. By flapping arms like a bird gently and raising legs outwards to the front.  If ambulatory, the patient can go for walks for 30 minutes 3 times a week. Then increase the intensity and duration as tolerated.  Goal is to try to attain exercise frequency to 5 times a week.  If applicable: Best to perform resistance exercises (machines or weights) 2 days a week and cardio type exercises 3 days per week.  DM foot care handout in the [facility] reviewed with patient.  Orders Placed This Encounter  Procedures  . CMP14+EGFR  . NMR, lipoprofile  . Vit D  25 hydroxy (rtn osteoporosis monitoring)  .  Microalbumin, urine  . POCT glycosylated hemoglobin (Hb A1C)  . POCT UA - Microalbumin  no changes in medications. No orders of the defined types were placed in this encounter.   There are no discontinued medications. Return in about 3 months (around 01/16/2014) for Recheck medical problems.  Keane Martelli P. Modesto Charon, M.D.

## 2013-10-16 NOTE — Patient Instructions (Signed)
    Dr Melady Chow's Recommendations  For nutrition information, I recommend books:  1).Eat to Live by Dr Joel Fuhrman. 2).Prevent and Reverse Heart Disease by Dr Caldwell Esselstyn. 3) Dr Neal Barnard's Book:  Program to Reverse Diabetes  Exercise recommendations are:  If unable to walk, then the patient can exercise in a chair 3 times a day. By flapping arms like a bird gently and raising legs outwards to the front.  If ambulatory, the patient can go for walks for 30 minutes 3 times a week. Then increase the intensity and duration as tolerated.  Goal is to try to attain exercise frequency to 5 times a week.  If applicable: Best to perform resistance exercises (machines or weights) 2 days a week and cardio type exercises 3 days per week.   Diabetes and Foot Care Diabetes may cause you to have a poor blood supply (circulation) to your legs and feet. Because of this, the skin may be thinner, break easier, and heal more slowly. You also may have nerve damage in your legs and feet causing decreased feeling. You may not notice minor injuries to your feet that could lead to serious problems or infections. Taking care of your feet is one of the most important things you can do for yourself.  HOME CARE INSTRUCTIONS  Do not go barefoot. Bare feet are easily injured.  Check your feet daily for blisters, cuts, and redness.  Wash your feet with warm water (not hot) and mild soap. Pat your feet and between your toes until completely dry.  Apply a moisturizing lotion that does not contain alcohol or petroleum jelly to the dry skin on your feet and to dry brittle toenails. Do not put it between your toes.  Trim your toenails straight across. Do not dig under them or around the cuticle.  Do not cut corns or calluses, or try to remove them with medicine.  Wear clean cotton socks or stockings every day. Make sure they are not too tight. Do not wear knee high stockings since they may decrease  blood flow to your legs.  Wear leather shoes that fit properly and have enough cushioning. To break in new shoes, wear them just a few hours a day to avoid injuring your feet.  Wear shoes at all times, even in the house.  Do not cross your legs. This may decrease the blood flow to your feet.  If you find a minor scrape, cut, or break in the skin on your feet, keep it and the skin around it clean and dry. These areas may be cleansed with mild soap and water. Do not use peroxide, alcohol, iodine or Merthiolate.  When you remove an adhesive bandage, be sure not to harm the skin around it.  If you have a wound, look at it several times a day to make sure it is healing.  Do not use heating pads or hot water bottles. Burns can occur. If you have lost feeling in your feet or legs, you may not know it is happening until it is too late.  Report any cuts, sores or bruises to your caregiver. Do not wait! SEEK MEDICAL CARE IF:   You have an injury that is not healing or you notice redness, numbness, burning, or tingling.  Your feet always feel cold.  You have pain or cramps in your legs and feet. SEEK IMMEDIATE MEDICAL CARE IF:   There is increasing redness, swelling, or increasing pain in the wound.    There is a red line that goes up your leg.  Pus is coming from a wound.  You develop an unexplained oral temperature above 102 F (38.9 C), or as your caregiver suggests.  You notice a bad smell coming from an ulcer or wound. MAKE SURE YOU:   Understand these instructions.  Will watch your condition.  Will get help right away if you are not doing well or get worse. Document Released: 12/14/2000 Document Revised: 03/10/2012 Document Reviewed: 06/22/2009 [employer] Patient Information 2014 [employer].  

## 2013-10-17 ENCOUNTER — Other Ambulatory Visit: Payer: Self-pay | Admitting: Family Medicine

## 2013-10-17 LAB — CMP14+EGFR
ALT: 23 IU/L (ref 0–44)
AST: 23 IU/L (ref 0–40)
Albumin/Globulin Ratio: 2.1 (ref 1.1–2.5)
Albumin: 4.7 g/dL (ref 3.6–4.8)
Alkaline Phosphatase: 49 IU/L (ref 39–117)
BUN/Creatinine Ratio: 21 (ref 10–22)
BUN: 19 mg/dL (ref 8–27)
CO2: 23 mmol/L (ref 18–29)
Calcium: 9.8 mg/dL (ref 8.6–10.2)
Chloride: 100 mmol/L (ref 97–108)
Creatinine, Ser: 0.91 mg/dL (ref 0.76–1.27)
GFR calc Af Amer: 103 mL/min/{1.73_m2} (ref >59)
GFR calc non Af Amer: 89 mL/min/{1.73_m2} (ref >59)
Globulin, Total: 2.2 g/dL (ref 1.5–4.5)
Glucose: 104 mg/dL — ABNORMAL HIGH (ref 65–99)
Potassium: 5.4 mmol/L — ABNORMAL HIGH (ref 3.5–5.2)
Sodium: 139 mmol/L (ref 134–144)
Total Bilirubin: 0.7 mg/dL (ref 0.0–1.2)
Total Protein: 6.9 g/dL (ref 6.0–8.5)

## 2013-10-17 LAB — NMR, LIPOPROFILE
Cholesterol: 192 mg/dL (ref <200)
HDL Cholesterol by NMR: 43 mg/dL (ref >=40)
HDL Particle Number: 30.4 umol/L — ABNORMAL LOW (ref >=30.5)
LDL Particle Number: 1679 nmol/L — ABNORMAL HIGH (ref <1000)
LDL Size: 20.3 nm — ABNORMAL LOW (ref >20.5)
LDLC SERPL CALC-MCNC: 133 mg/dL — ABNORMAL HIGH (ref <100)
LP-IR Score: 48 — ABNORMAL HIGH (ref <=45)
Small LDL Particle Number: 935 nmol/L — ABNORMAL HIGH (ref <=527)
Triglycerides by NMR: 82 mg/dL (ref <150)

## 2013-10-17 LAB — MICROALBUMIN, URINE: Microalbumin, Urine: <3 ug/mL (ref 0.0–17.0)

## 2013-10-17 LAB — VITAMIN D 25 HYDROXY (VIT D DEFICIENCY, FRACTURES): Vit D, 25-Hydroxy: 56.3 ng/mL (ref 30.0–100.0)

## 2013-10-17 MED ORDER — ATORVASTATIN CALCIUM 40 MG PO TABS: 40.0000 mg | ORAL_TABLET | Freq: Every day | ORAL | Status: DC

## 2013-10-21 ENCOUNTER — Telehealth: Payer: Self-pay | Admitting: Family Medicine

## 2013-10-21 NOTE — Telephone Encounter (Signed)
See note with labs

## 2013-10-21 NOTE — Progress Notes (Signed)
Quick Note:  Need to see Clinical Pharmacist-Tammy or Marcelino Duster- for patient education, medication review and adjustment to achieve goals. ______

## 2013-10-21 NOTE — Telephone Encounter (Signed)
Spoke with pt and he states he is taking 1/2 tablet of Janumet and has been on this also verbalizes he can not take atorvasttin  PT AWARE [facility] IS OUT OF TOWN AND WILL ADDRESS NEXT WEEK.

## 2013-10-22 NOTE — Telephone Encounter (Signed)
Left message on pt VM that Dr Modesto Charon wants him to sch appt with Tammy and to call back if further questions

## 2013-11-03 ENCOUNTER — Telehealth: Payer: Self-pay | Admitting: Family Medicine

## 2013-11-06 MED ORDER — DUTASTERIDE-TAMSULOSIN HCL 0.5-0.4 MG PO CAPS: 1.0000 | ORAL_CAPSULE | Freq: Every day | ORAL | Status: DC

## 2013-11-06 NOTE — Telephone Encounter (Signed)
done

## 2014-01-21 ENCOUNTER — Ambulatory Visit: Payer: BC Managed Care – PPO | Admitting: Family Medicine

## 2014-02-01 ENCOUNTER — Telehealth: Payer: Self-pay | Admitting: Family Medicine

## 2014-02-01 NOTE — Telephone Encounter (Signed)
Gave to [employer]

## 2014-02-04 ENCOUNTER — Telehealth: Payer: Self-pay | Admitting: Family Medicine

## 2014-02-04 ENCOUNTER — Ambulatory Visit: Payer: BC Managed Care – PPO | Admitting: Family Medicine

## 2014-02-05 ENCOUNTER — Encounter: Payer: Self-pay | Admitting: Family Medicine

## 2014-02-05 ENCOUNTER — Ambulatory Visit (INDEPENDENT_AMBULATORY_CARE_PROVIDER_SITE_OTHER): Payer: Medicare Other | Admitting: Family Medicine

## 2014-02-05 VITALS — BP 130/76 | HR 74 | Temp 97.7°F | Ht 70.0 in | Wt 222.0 lb

## 2014-02-05 DIAGNOSIS — E8881 Metabolic syndrome: Secondary | ICD-10-CM

## 2014-02-05 DIAGNOSIS — N400 Enlarged prostate without lower urinary tract symptoms: Secondary | ICD-10-CM | POA: Insufficient documentation

## 2014-02-05 DIAGNOSIS — I10 Essential (primary) hypertension: Secondary | ICD-10-CM

## 2014-02-05 DIAGNOSIS — K219 Gastro-esophageal reflux disease without esophagitis: Secondary | ICD-10-CM

## 2014-02-05 DIAGNOSIS — E1310 Other specified diabetes mellitus with ketoacidosis without coma: Secondary | ICD-10-CM | POA: Diagnosis not present

## 2014-02-05 DIAGNOSIS — E1110 Type 2 diabetes mellitus with ketoacidosis without coma: Secondary | ICD-10-CM

## 2014-02-05 DIAGNOSIS — Z125 Encounter for screening for malignant neoplasm of prostate: Secondary | ICD-10-CM

## 2014-02-05 DIAGNOSIS — E785 Hyperlipidemia, unspecified: Secondary | ICD-10-CM

## 2014-02-05 DIAGNOSIS — IMO0002 Reserved for concepts with insufficient information to code with codable children: Secondary | ICD-10-CM

## 2014-02-05 DIAGNOSIS — M170 Bilateral primary osteoarthritis of knee: Secondary | ICD-10-CM

## 2014-02-05 DIAGNOSIS — M1710 Unilateral primary osteoarthritis, unspecified knee: Secondary | ICD-10-CM

## 2014-02-05 LAB — POCT GLYCOSYLATED HEMOGLOBIN (HGB A1C): Hemoglobin A1C: 5.4

## 2014-02-05 MED ORDER — SITAGLIPTIN PHOS-METFORMIN HCL 50-1000 MG PO TABS: ORAL_TABLET | ORAL | Status: AC

## 2014-02-05 MED ORDER — OMEPRAZOLE 20 MG PO CPDR: 20.0000 mg | DELAYED_RELEASE_CAPSULE | Freq: Every day | ORAL | Status: AC

## 2014-02-05 MED ORDER — LISINOPRIL 20 MG PO TABS: 10.0000 mg | ORAL_TABLET | Freq: Every day | ORAL | Status: DC

## 2014-02-05 MED ORDER — GLUCOSE BLOOD VI STRP: ORAL_STRIP | Status: DC

## 2014-02-05 MED ORDER — FINASTERIDE 5 MG PO TABS: 5.0000 mg | ORAL_TABLET | Freq: Every day | ORAL | Status: DC

## 2014-02-05 MED ORDER — ONETOUCH DELICA LANCETS 33G MISC: 1.0000 | Freq: Every morning | Status: DC

## 2014-02-05 NOTE — Progress Notes (Signed)
Patient ID: Michael Tapia, male   DOB: 04/04/1951, 63 y.o.   MRN: [id number] SUBJECTIVE: CC: Chief Complaint  Patient presents with  . Follow-up    3 month followup   . Medication Refill    wants 90 supply need to change jalyn due to insurance     HPI: Patient is here for follow up of Diabetes Mellitus/HTN/HLD/Arthritis: Symptoms evaluated: Denies Nocturia ,Denies Urinary Frequency , denies Blurred vision ,deniesDizziness,denies.Dysuria,denies paresthesias, denies extremity pain or ulcers..denies chest pain. has had an annual eye exam. do check the feet. Does check CBGs. Average CBG:thinks fairly good. Did not bring his readings Denies episodes of hypoglycemia. Does have an emergency hypoglycemic plan. admits toCompliance with medications. Denies Problems with medications.  Past Medical History  Diagnosis Date  . Hyperlipidemia   . Hypertension   . Diabetes mellitus without complication   . Arthritis     knees  . GERD (gastroesophageal reflux disease)   . Metabolic syndrome   . BPH (benign prostatic hyperplasia)    No past surgical history on file. History   Social History  . Marital Status: Married    Spouse Name: N/A    Number of Children: N/A  . Years of Education: N/A   Occupational History  .      retired   Social History Main Topics  . Smoking status: Former Smoker    Quit date: 03/15/1979  . Smokeless tobacco: Not on file  . Alcohol Use: No  . Drug Use: No  . Sexual Activity: Not on file   Other Topics Concern  . Not on file   Social History Narrative   married   No family history on file. Current Outpatient Prescriptions on File Prior to Visit  Medication Sig Dispense Refill  . aspirin 81 MG tablet Take 81 mg by mouth daily.      . Coenzyme Q10 10 MG capsule Take 10 mg by mouth daily.      . VITAMIN D, ERGOCALCIFEROL, PO Take 1 tablet by mouth daily.      . Dutasteride-Tamsulosin HCl (JALYN) 0.5-0.4 MG CAPS Take 1 capsule by mouth daily.  90  capsule  1   No current facility-administered medications on file prior to visit.   Allergies  Allergen Reactions  . Bee Venom   . Ivp Dye [Iodinated Diagnostic Agents]   . Penicillins    Immunization History  Administered Date(s) Administered  . Influenza Whole 11/14/2012  . Influenza,inj,Quad PF,36+ Mos 10/16/2013  . Pneumococcal Polysaccharide-23 04/07/2012  . Td 08/14/2012   Prior to Admission medications   Medication Sig Start Date End Date Taking? Authorizing Provider  aspirin 81 MG tablet Take 81 mg by mouth daily.    Historical Provider, MD  atorvastatin (LIPITOR) 40 MG tablet Take 1 tablet (40 mg total) by mouth daily. 10/17/13   Francis P Wong, MD  Coenzyme Q10 10 MG capsule Take 10 mg by mouth daily.    Historical Provider, MD  Dutasteride-Tamsulosin HCl (JALYN) 0.5-0.4 MG CAPS Take 1 capsule by mouth daily. 11/06/13   Francis P Wong, MD  glucose blood (ONE TOUCH ULTRA TEST) test strip Use daily 04/01/13   Francis P Wong, MD  JANUMET 50-1000 MG per tablet TAKE 1 TABLET TWICE A DAY 08/27/13   Francis P Wong, MD  lisinopril (PRINIVIL,ZESTRIL) 20 MG tablet Take 20 mg by mouth daily.    Historical Provider, MD  omeprazole (PRILOSEC) 20 MG capsule Take 20 mg by mouth daily.    Historical Provider, MD    ONETOUCH DELICA LANCETS 70Y MISC 1 each by Does not apply route every morning. 04/01/13   Vernie Shanks, MD  VITAMIN D, ERGOCALCIFEROL, PO Take 1 tablet by mouth daily.    Historical Provider, MD     ROS: As above in the HPI. All other systems are stable or negative.  OBJECTIVE: APPEARANCE:  Patient in no acute distress.The patient appeared well nourished and normally developed. Acyanotic. Waist: VITAL SIGNS:BP 130/76  Pulse 74  Temp(Src) 97.7 F (36.5 C) (Oral)  Ht 5' 10" (1.778 m)  Wt 222 lb (100.699 kg)  BMI 31.85 kg/m2  WM. Mild obesity.  SKIN: warm and  Dry without overt rashes, tattoos and scars  HEAD and Neck: without JVD, Head and scalp: normal Eyes:No  scleral icterus. Fundi normal, eye movements normal. Ears: Auricle normal, canal normal, Tympanic membranes normal, insufflation normal. Nose: normal Throat: normal Neck & thyroid: normal  CHEST & LUNGS: Chest wall: normal Lungs: Clear  CVS: Reveals the PMI to be normally located. Regular rhythm, First and Second Heart sounds are normal,  absence of murmurs, rubs or gallops. Peripheral vasculature: Radial pulses: normal Dorsal pedis pulses: normal Posterior pulses: normal  ABDOMEN:  Appearance: normal Benign, no organomegaly, no masses, no Abdominal Aortic enlargement. No Guarding , no rebound. No Bruits. Bowel sounds: normal  RECTAL: N/A GU: N/A  EXTREMETIES: nonedematous.  MUSCULOSKELETAL:  Spine: normal Joints: intact  NEUROLOGIC: oriented to time,place and person; nonfocal. Strength is normal Sensory is normal Reflexes are normal Cranial Nerves are normal.  ASSESSMENT: Unspecified essential hypertension - Plan: CMP14+EGFR, lisinopril (PRINIVIL,ZESTRIL) 20 MG tablet  Other and unspecified hyperlipidemia - Plan: CMP14+EGFR, NMR, lipoprofile  Metabolic syndrome  GERD (gastroesophageal reflux disease) - Plan: omeprazole (PRILOSEC) 20 MG capsule  Osteoarthritis of both knees  DM (diabetes mellitus) type 2, uncontrolled, with ketoacidosis - Plan: POCT glycosylated hemoglobin (Hb O3Z), ONETOUCH DELICA LANCETS 85Y MISC, glucose blood (ONE TOUCH ULTRA TEST) test strip, sitaGLIPtin-metformin (JANUMET) 50-1000 MG per tablet  BPH (benign prostatic hyperplasia) - Plan: PSA, total and free, finasteride (PROSCAR) 5 MG tablet  Screening for prostate cancer - Plan: PSA, total and free  PLAN: Discussed a plant based diet.  Orders Placed This Encounter  Procedures  . CMP14+EGFR  . NMR, lipoprofile  . PSA, total and free  . POCT glycosylated hemoglobin (Hb A1C)   Meds ordered this encounter  Medications  . DISCONTD: sitaGLIPtin-metformin (JANUMET) 50-1000 MG per  tablet    Sig: TAKE 1 TABLET once a day  . ONETOUCH DELICA LANCETS 85O MISC    Sig: 1 each by Does not apply route every morning.    Dispense:  100 each    Refill:  3  . glucose blood (ONE TOUCH ULTRA TEST) test strip    Sig: Use daily    Dispense:  100 each    Refill:  3  . lisinopril (PRINIVIL,ZESTRIL) 20 MG tablet    Sig: Take 0.5 tablets (10 mg total) by mouth daily.    Dispense:  90 tablet    Refill:  3  . sitaGLIPtin-metformin (JANUMET) 50-1000 MG per tablet    Sig: TAKE 1 TABLET once a day    Dispense:  90 tablet    Refill:  3  . omeprazole (PRILOSEC) 20 MG capsule    Sig: Take 1 capsule (20 mg total) by mouth daily.    Dispense:  90 capsule    Refill:  3  . finasteride (PROSCAR) 5 MG tablet    Sig: Take 1  tablet (5 mg total) by mouth daily.    Dispense:  90 tablet    Refill:  3   Medications Discontinued During This Encounter  Medication Reason  . atorvastatin (LIPITOR) 40 MG tablet Side effect (s)  . JANUMET 50-1000 MG per tablet   . ONETOUCH DELICA LANCETS 76E MISC Reorder  . glucose blood (ONE TOUCH ULTRA TEST) test strip Reorder  . lisinopril (PRINIVIL,ZESTRIL) 20 MG tablet Reorder  . sitaGLIPtin-metformin (JANUMET) 50-1000 MG per tablet Reorder  . omeprazole (PRILOSEC) 20 MG capsule Reorder   Return in about 3 months (around 05/05/2014) for Recheck medical problems.  Antoria Lanza P. Jacelyn Grip, M.D.

## 2014-02-06 ENCOUNTER — Other Ambulatory Visit: Payer: Self-pay | Admitting: Family Medicine

## 2014-02-06 DIAGNOSIS — E785 Hyperlipidemia, unspecified: Secondary | ICD-10-CM

## 2014-02-06 LAB — CMP14+EGFR
ALT: 20 IU/L (ref 0–44)
AST: 18 IU/L (ref 0–40)
Albumin/Globulin Ratio: 2 (ref 1.1–2.5)
Albumin: 4.7 g/dL (ref 3.6–4.8)
Alkaline Phosphatase: 49 IU/L (ref 39–117)
BUN/Creatinine Ratio: 17 (ref 10–22)
BUN: 17 mg/dL (ref 8–27)
CO2: 24 mmol/L (ref 18–29)
Calcium: 10 mg/dL (ref 8.6–10.2)
Chloride: 101 mmol/L (ref 97–108)
Creatinine, Ser: 0.99 mg/dL (ref 0.76–1.27)
GFR calc Af Amer: 93 mL/min/{1.73_m2} (ref >59)
GFR calc non Af Amer: 80 mL/min/{1.73_m2} (ref >59)
Globulin, Total: 2.3 g/dL (ref 1.5–4.5)
Glucose: 95 mg/dL (ref 65–99)
Potassium: 4.9 mmol/L (ref 3.5–5.2)
Sodium: 140 mmol/L (ref 134–144)
Total Bilirubin: 0.5 mg/dL (ref 0.0–1.2)
Total Protein: 7 g/dL (ref 6.0–8.5)

## 2014-02-06 LAB — NMR, LIPOPROFILE
Cholesterol: 211 mg/dL — ABNORMAL HIGH (ref <200)
HDL Cholesterol by NMR: 49 mg/dL (ref >=40)
HDL Particle Number: 30.8 umol/L (ref >=30.5)
LDL Particle Number: 1716 nmol/L — ABNORMAL HIGH (ref <1000)
LDL Size: 21 nm (ref >20.5)
LDLC SERPL CALC-MCNC: 136 mg/dL — ABNORMAL HIGH (ref <100)
LP-IR Score: 44 (ref <=45)
Small LDL Particle Number: 437 nmol/L (ref <=527)
Triglycerides by NMR: 130 mg/dL (ref <150)

## 2014-02-06 LAB — PSA, TOTAL AND FREE
PSA, Free Pct: 19.2 %
PSA, Free: 0.25 ng/mL
PSA: 1.3 ng/mL (ref 0.0–4.0)

## 2014-02-06 MED ORDER — ATORVASTATIN CALCIUM 20 MG PO TABS: 20.0000 mg | ORAL_TABLET | Freq: Every day | ORAL | Status: DC

## 2014-02-08 ENCOUNTER — Other Ambulatory Visit: Payer: Self-pay | Admitting: Family Medicine

## 2014-02-08 DIAGNOSIS — E785 Hyperlipidemia, unspecified: Secondary | ICD-10-CM

## 2014-02-08 MED ORDER — ROSUVASTATIN CALCIUM 10 MG PO TABS: 10.0000 mg | ORAL_TABLET | Freq: Every day | ORAL | Status: DC

## 2014-02-08 NOTE — Progress Notes (Signed)
Rx sent via EPIC to his mail order.

## 2014-04-09 ENCOUNTER — Encounter: Payer: Self-pay | Admitting: *Deleted

## 2014-04-21 NOTE — Telephone Encounter (Signed)
done

## 2014-05-03 ENCOUNTER — Ambulatory Visit (INDEPENDENT_AMBULATORY_CARE_PROVIDER_SITE_OTHER): Payer: Medicare Other | Admitting: Family Medicine

## 2014-05-03 ENCOUNTER — Encounter: Payer: Self-pay | Admitting: Family Medicine

## 2014-05-03 VITALS — BP 126/76 | HR 68 | Temp 98.6°F | Ht 70.0 in | Wt 225.0 lb

## 2014-05-03 DIAGNOSIS — E1310 Other specified diabetes mellitus with ketoacidosis without coma: Secondary | ICD-10-CM

## 2014-05-03 DIAGNOSIS — Z23 Encounter for immunization: Secondary | ICD-10-CM | POA: Diagnosis not present

## 2014-05-03 DIAGNOSIS — N400 Enlarged prostate without lower urinary tract symptoms: Secondary | ICD-10-CM

## 2014-05-03 DIAGNOSIS — I10 Essential (primary) hypertension: Secondary | ICD-10-CM | POA: Diagnosis not present

## 2014-05-03 DIAGNOSIS — M1710 Unilateral primary osteoarthritis, unspecified knee: Secondary | ICD-10-CM

## 2014-05-03 DIAGNOSIS — IMO0001 Reserved for inherently not codable concepts without codable children: Secondary | ICD-10-CM | POA: Diagnosis not present

## 2014-05-03 DIAGNOSIS — E785 Hyperlipidemia, unspecified: Secondary | ICD-10-CM | POA: Insufficient documentation

## 2014-05-03 DIAGNOSIS — M170 Bilateral primary osteoarthritis of knee: Secondary | ICD-10-CM

## 2014-05-03 DIAGNOSIS — E1165 Type 2 diabetes mellitus with hyperglycemia: Principal | ICD-10-CM

## 2014-05-03 DIAGNOSIS — E8881 Metabolic syndrome: Secondary | ICD-10-CM

## 2014-05-03 DIAGNOSIS — E1110 Type 2 diabetes mellitus with ketoacidosis without coma: Secondary | ICD-10-CM | POA: Insufficient documentation

## 2014-05-03 DIAGNOSIS — IMO0002 Reserved for concepts with insufficient information to code with codable children: Secondary | ICD-10-CM

## 2014-05-03 DIAGNOSIS — K219 Gastro-esophageal reflux disease without esophagitis: Secondary | ICD-10-CM

## 2014-05-03 LAB — POCT GLYCOSYLATED HEMOGLOBIN (HGB A1C): Hemoglobin A1C: 5.5

## 2014-05-03 NOTE — Patient Instructions (Signed)
      Dr Zakaria Fromer's Recommendations  For nutrition information, I recommend books:  1).Eat to Live by Dr Joel Fuhrman. 2).Prevent and Reverse Heart Disease by Dr Caldwell Esselstyn. 3) Dr Neal Barnard's Book:  Program to Reverse Diabetes  Exercise recommendations are:  If unable to walk, then the patient can exercise in a chair 3 times a day. By flapping arms like a bird gently and raising legs outwards to the front.  If ambulatory, the patient can go for walks for 30 minutes 3 times a week. Then increase the intensity and duration as tolerated.  Goal is to try to attain exercise frequency to 5 times a week.  If applicable: Best to perform resistance exercises (machines or weights) 2 days a week and cardio type exercises 3 days per week.  

## 2014-05-03 NOTE — Progress Notes (Signed)
Patient ID: Michael Tapia, male   DOB: [birth year], 63 y.o.   MRN: [id number] SUBJECTIVE: CC: Chief Complaint  Patient presents with  . Hypertension  . Diabetes  . Hyperlipidemia    HPI: Patient is here for follow up of Diabetes Mellitus/HLD/HTN: Symptoms evaluated: Denies Nocturia ,Denies Urinary Frequency , denies Blurred vision ,deniesDizziness,denies.Dysuria,denies paresthesias, denies extremity pain or ulcers.Marland Kitchendenies chest pain. has had an annual eye exam. do check the feet. Does check CBGs. Average CBG:90-110 Denies episodes of hypoglycemia. Does have an emergency hypoglycemic plan. admits toCompliance with medications. Denies Problems with medications.   Doing fine  Past Medical History  Diagnosis Date  . Hyperlipidemia   . Hypertension   . Diabetes mellitus without complication   . Arthritis     knees  . GERD (gastroesophageal reflux disease)   . Metabolic syndrome   . BPH (benign prostatic hyperplasia)    No past surgical history on file. History   Social History  . Marital Status: Married    Spouse Name: N/A    Number of Children: N/A  . Years of Education: N/A   Occupational History  .      retired   Social History Main Topics  . Smoking status: Former Smoker    Quit date: 03/15/1979  . Smokeless tobacco: Not on file  . Alcohol Use: No  . Drug Use: No  . Sexual Activity: Not on file   Other Topics Concern  . Not on file   Social History Narrative   married   No family history on file. Current Outpatient Prescriptions on File Prior to Visit  Medication Sig Dispense Refill  . aspirin 81 MG tablet Take 81 mg by mouth daily.      . Coenzyme Q10 10 MG capsule Take 10 mg by mouth daily.      . finasteride (PROSCAR) 5 MG tablet Take 1 tablet (5 mg total) by mouth daily.  90 tablet  3  . glucose blood (ONE TOUCH ULTRA TEST) test strip Use daily  100 each  3  . lisinopril (PRINIVIL,ZESTRIL) 20 MG tablet Take 0.5 tablets (10 mg total) by mouth  daily.  90 tablet  3  . omeprazole (PRILOSEC) 20 MG capsule Take 1 capsule (20 mg total) by mouth daily.  90 capsule  3  . ONETOUCH DELICA LANCETS 33G MISC 1 each by Does not apply route every morning.  100 each  3  . rosuvastatin (CRESTOR) 10 MG tablet Take 1 tablet (10 mg total) by mouth daily.  90 tablet  3  . sitaGLIPtin-metformin (JANUMET) 50-1000 MG per tablet TAKE 1 TABLET once a day  90 tablet  3  . VITAMIN D, ERGOCALCIFEROL, PO Take 1 tablet by mouth daily.       No current facility-administered medications on file prior to visit.   Allergies  Allergen Reactions  . Bee Venom   . Ivp Dye [Iodinated Diagnostic Agents]   . Penicillins    Immunization History  Administered Date(s) Administered  . Influenza Whole 11/14/2012  . Influenza,inj,Quad PF,36+ Mos 10/16/2013  . Pneumococcal Conjugate-13 05/03/2014  . Pneumococcal Polysaccharide-23 04/07/2012  . Td 08/14/2012   Prior to Admission medications   Medication Sig Start Date End Date Taking? Authorizing Provider  aspirin 81 MG tablet Take 81 mg by mouth daily.   Yes Historical Provider, MD  Coenzyme Q10 10 MG capsule Take 10 mg by mouth daily.   Yes Historical Provider, MD  finasteride (PROSCAR) 5 MG tablet Take 1 tablet (5  mg total) by mouth daily. 02/05/14  Yes Vernie Shanks, MD  glucose blood (ONE TOUCH ULTRA TEST) test strip Use daily 02/05/14  Yes Vernie Shanks, MD  lisinopril (PRINIVIL,ZESTRIL) 20 MG tablet Take 0.5 tablets (10 mg total) by mouth daily. 02/05/14  Yes Vernie Shanks, MD  omeprazole (PRILOSEC) 20 MG capsule Take 1 capsule (20 mg total) by mouth daily. 02/05/14  Yes Vernie Shanks, MD  [facility] DELICA LANCETS 23R MISC 1 each by Does not apply route every morning. 02/05/14  Yes Vernie Shanks, MD  rosuvastatin (CRESTOR) 10 MG tablet Take 1 tablet (10 mg total) by mouth daily. 02/08/14  Yes Vernie Shanks, MD  sitaGLIPtin-metformin (JANUMET) 50-1000 MG per tablet TAKE 1 TABLET once a day 02/05/14  Yes Vernie Shanks, MD   VITAMIN D, ERGOCALCIFEROL, PO Take 1 tablet by mouth daily.   Yes Historical Provider, MD     ROS: As above in the HPI. All other systems are stable or negative.  OBJECTIVE: APPEARANCE:  Patient in no acute distress.The patient appeared well nourished and normally developed. Acyanotic. Waist: VITAL SIGNS:BP 126/76  Pulse 68  Temp(Src) 98.6 F (37 C) (Oral)  Ht $R'5\' 10"'PK$  (1.778 m)  Wt 225 lb (102.059 kg)  BMI 32.28 kg/m2 WM obese  SKIN: warm and  Dry without overt rashes, tattoos and scars  HEAD and Neck: without JVD, Head and scalp: normal Eyes:No scleral icterus. Fundi normal, eye movements normal. Ears: Auricle normal, canal normal, Tympanic membranes normal, insufflation normal. Nose: normal Throat: normal Neck & thyroid: normal  CHEST & LUNGS: Chest wall: normal Lungs: Clear  CVS: Reveals the PMI to be normally located. Regular rhythm, First and Second Heart sounds are normal,  absence of murmurs, rubs or gallops. Peripheral vasculature: Radial pulses: normal Dorsal pedis pulses: normal Posterior pulses: normal  ABDOMEN:  Appearance: normal Benign, no organomegaly, no masses, no Abdominal Aortic enlargement. No Guarding , no rebound. No Bruits. Bowel sounds: normal  RECTAL: N/A GU: N/A  EXTREMETIES: nonedematous.  MUSCULOSKELETAL:  Spine: normal Joints: intact  NEUROLOGIC: oriented to time,place and person; nonfocal. Strength is normal Sensory is normal Reflexes are normal Cranial Nerves are normal.  ASSESSMENT:  Type II or unspecified type diabetes mellitus without mention of complication, uncontrolled - Plan: POCT glycosylated hemoglobin (Hb A1C)  Unspecified essential hypertension - Plan: CMP14+EGFR  HLD (hyperlipidemia) - Plan: NMR, lipoprofile  Metabolic syndrome  BPH (benign prostatic hyperplasia)  GERD (gastroesophageal reflux disease)  Osteoarthritis of both knees  Need for vaccination with 13-polyvalent pneumococcal  conjugate vaccine - Plan: Pneumococcal conjugate vaccine 13-valent  PLAN:      Dr Paula Libra Recommendations  For nutrition information, I recommend books:  1).Eat to Live by Dr Excell Seltzer. 2).Prevent and Reverse Heart Disease by Dr Karl Luke. 3) Dr Janene Harvey Book:  Program to Reverse Diabetes  Exercise recommendations are:  If unable to walk, then the patient can exercise in a chair 3 times a day. By flapping arms like a bird gently and raising legs outwards to the front.  If ambulatory, the patient can go for walks for 30 minutes 3 times a week. Then increase the intensity and duration as tolerated.  Goal is to try to attain exercise frequency to 5 times a week.  If applicable: Best to perform resistance exercises (machines or weights) 2 days a week and cardio type exercises 3 days per week.  Orders Placed This Encounter  Procedures  . Pneumococcal conjugate vaccine 13-valent  .  CMP14+EGFR  . NMR, lipoprofile  . POCT glycosylated hemoglobin (Hb A1C)   No orders of the defined types were placed in this encounter.   Medications Discontinued During This Encounter  Medication Reason  . Dutasteride-Tamsulosin HCl (JALYN) 0.5-0.4 MG CAPS Change in therapy   Return in about 3 months (around 08/03/2014) for Recheck medical problems.  Talaysha Freeberg P. Jacelyn Grip, M.D.

## 2014-05-04 LAB — CMP14+EGFR
ALT: 28 IU/L (ref 0–44)
AST: 32 IU/L (ref 0–40)
Albumin/Globulin Ratio: 2.3 (ref 1.1–2.5)
Albumin: 4.6 g/dL (ref 3.6–4.8)
Alkaline Phosphatase: 45 IU/L (ref 39–117)
BUN/Creatinine Ratio: 23 — ABNORMAL HIGH (ref 10–22)
BUN: 23 mg/dL (ref 8–27)
CO2: 23 mmol/L (ref 18–29)
Calcium: 9.7 mg/dL (ref 8.6–10.2)
Chloride: 102 mmol/L (ref 97–108)
Creatinine, Ser: 0.99 mg/dL (ref 0.76–1.27)
GFR calc Af Amer: 92 mL/min/{1.73_m2} (ref >59)
GFR calc non Af Amer: 80 mL/min/{1.73_m2} (ref >59)
Globulin, Total: 2 g/dL (ref 1.5–4.5)
Glucose: 98 mg/dL (ref 65–99)
Potassium: 4.6 mmol/L (ref 3.5–5.2)
Sodium: 140 mmol/L (ref 134–144)
Total Bilirubin: 0.6 mg/dL (ref 0.0–1.2)
Total Protein: 6.6 g/dL (ref 6.0–8.5)

## 2014-05-04 LAB — NMR, LIPOPROFILE
Cholesterol: 123 mg/dL (ref <200)
HDL Cholesterol by NMR: 46 mg/dL (ref >=40)
HDL Particle Number: 32.6 umol/L (ref >=30.5)
LDL Particle Number: 775 nmol/L (ref <1000)
LDL Size: 20.5 nm (ref >20.5)
LDLC SERPL CALC-MCNC: 58 mg/dL (ref <100)
LP-IR Score: 42 (ref <=45)
Small LDL Particle Number: 334 nmol/L (ref <=527)
Triglycerides by NMR: 95 mg/dL (ref <150)

## 2014-05-06 ENCOUNTER — Telehealth: Payer: Self-pay | Admitting: *Deleted

## 2014-05-06 NOTE — Telephone Encounter (Signed)
Message copied by Shelbie Ammons on Thu May 06, 2014  3:58 PM ------      Message from: Vernie Shanks      Created: Thu May 06, 2014  3:00 PM       Call Patient      Lab result at or close to goal. The DM may be too well controlled.      No change in Medications for now. If the HGBA1C is too low at next visit then he may need to reduce the DM medications,      No Change in recommendations.      No change in plans for follow up. ------

## 2014-05-06 NOTE — Telephone Encounter (Signed)
Aware of results. 

## 2014-05-07 ENCOUNTER — Ambulatory Visit: Payer: Medicare Other | Admitting: Family Medicine

## 2014-08-03 DIAGNOSIS — N400 Enlarged prostate without lower urinary tract symptoms: Secondary | ICD-10-CM | POA: Diagnosis not present

## 2014-08-03 DIAGNOSIS — R3989 Other symptoms and signs involving the genitourinary system: Secondary | ICD-10-CM | POA: Diagnosis not present

## 2014-08-03 DIAGNOSIS — I10 Essential (primary) hypertension: Secondary | ICD-10-CM | POA: Diagnosis not present

## 2014-08-03 DIAGNOSIS — N529 Male erectile dysfunction, unspecified: Secondary | ICD-10-CM | POA: Diagnosis not present

## 2014-08-03 DIAGNOSIS — E119 Type 2 diabetes mellitus without complications: Secondary | ICD-10-CM | POA: Diagnosis not present

## 2014-08-03 DIAGNOSIS — Z125 Encounter for screening for malignant neoplasm of prostate: Secondary | ICD-10-CM | POA: Diagnosis not present

## 2014-09-14 DIAGNOSIS — B880 Other acariasis: Secondary | ICD-10-CM | POA: Diagnosis not present

## 2014-09-14 DIAGNOSIS — E669 Obesity, unspecified: Secondary | ICD-10-CM | POA: Diagnosis not present

## 2014-09-14 DIAGNOSIS — E119 Type 2 diabetes mellitus without complications: Secondary | ICD-10-CM | POA: Diagnosis not present

## 2014-09-14 DIAGNOSIS — N529 Male erectile dysfunction, unspecified: Secondary | ICD-10-CM | POA: Diagnosis not present

## 2014-09-14 DIAGNOSIS — N400 Enlarged prostate without lower urinary tract symptoms: Secondary | ICD-10-CM | POA: Diagnosis not present

## 2014-09-14 DIAGNOSIS — I10 Essential (primary) hypertension: Secondary | ICD-10-CM | POA: Diagnosis not present

## 2014-09-14 DIAGNOSIS — M79609 Pain in unspecified limb: Secondary | ICD-10-CM | POA: Diagnosis not present

## 2014-09-14 DIAGNOSIS — R3989 Other symptoms and signs involving the genitourinary system: Secondary | ICD-10-CM | POA: Diagnosis not present

## 2014-09-21 DIAGNOSIS — Z23 Encounter for immunization: Secondary | ICD-10-CM | POA: Diagnosis not present

## 2014-09-23 LAB — HM DIABETES EYE EXAM

## 2014-11-02 ENCOUNTER — Ambulatory Visit
Admission: RE | Admit: 2014-11-02 | Discharge: 2014-11-02 | Disposition: A | Discharge status: Home / Self Care | Payer: Medicare Other | Source: Ambulatory Visit | Attending: Family Medicine | Admitting: Family Medicine

## 2014-11-02 ENCOUNTER — Other Ambulatory Visit: Payer: Self-pay | Admitting: Family Medicine

## 2014-11-02 DIAGNOSIS — R079 Chest pain, unspecified: Secondary | ICD-10-CM

## 2014-11-02 DIAGNOSIS — M5412 Radiculopathy, cervical region: Secondary | ICD-10-CM

## 2014-11-02 DIAGNOSIS — M25511 Pain in right shoulder: Secondary | ICD-10-CM | POA: Diagnosis not present

## 2014-11-02 DIAGNOSIS — M5031 Other cervical disc degeneration,  high cervical region: Secondary | ICD-10-CM | POA: Diagnosis not present

## 2014-11-02 DIAGNOSIS — I10 Essential (primary) hypertension: Secondary | ICD-10-CM | POA: Diagnosis not present

## 2014-11-02 DIAGNOSIS — M5032 Other cervical disc degeneration, mid-cervical region: Secondary | ICD-10-CM | POA: Diagnosis not present

## 2014-11-02 DIAGNOSIS — M79673 Pain in unspecified foot: Secondary | ICD-10-CM | POA: Diagnosis not present

## 2014-11-02 DIAGNOSIS — N529 Male erectile dysfunction, unspecified: Secondary | ICD-10-CM | POA: Diagnosis not present

## 2014-11-02 DIAGNOSIS — E119 Type 2 diabetes mellitus without complications: Secondary | ICD-10-CM | POA: Diagnosis not present

## 2014-11-02 DIAGNOSIS — I447 Left bundle-branch block, unspecified: Secondary | ICD-10-CM | POA: Diagnosis not present

## 2014-11-02 DIAGNOSIS — M79601 Pain in right arm: Secondary | ICD-10-CM | POA: Diagnosis not present

## 2014-11-02 DIAGNOSIS — N400 Enlarged prostate without lower urinary tract symptoms: Secondary | ICD-10-CM | POA: Diagnosis not present

## 2014-11-02 DIAGNOSIS — E669 Obesity, unspecified: Secondary | ICD-10-CM | POA: Diagnosis not present

## 2014-11-11 DIAGNOSIS — R0789 Other chest pain: Secondary | ICD-10-CM | POA: Diagnosis not present

## 2014-11-11 DIAGNOSIS — R9431 Abnormal electrocardiogram [ECG] [EKG]: Secondary | ICD-10-CM | POA: Diagnosis not present

## 2014-11-11 DIAGNOSIS — I10 Essential (primary) hypertension: Secondary | ICD-10-CM | POA: Diagnosis not present

## 2014-11-11 DIAGNOSIS — E1169 Type 2 diabetes mellitus with other specified complication: Secondary | ICD-10-CM | POA: Diagnosis not present

## 2014-11-19 DIAGNOSIS — I10 Essential (primary) hypertension: Secondary | ICD-10-CM | POA: Diagnosis not present

## 2014-11-19 DIAGNOSIS — R0789 Other chest pain: Secondary | ICD-10-CM | POA: Diagnosis not present

## 2014-11-19 DIAGNOSIS — E119 Type 2 diabetes mellitus without complications: Secondary | ICD-10-CM | POA: Diagnosis not present

## 2014-11-22 DIAGNOSIS — Z981 Arthrodesis status: Secondary | ICD-10-CM | POA: Diagnosis not present

## 2014-11-22 DIAGNOSIS — M2578 Osteophyte, vertebrae: Secondary | ICD-10-CM | POA: Diagnosis not present

## 2014-11-22 DIAGNOSIS — M5011 Cervical disc disorder with radiculopathy,  high cervical region: Secondary | ICD-10-CM | POA: Diagnosis not present

## 2014-11-22 DIAGNOSIS — M4312 Spondylolisthesis, cervical region: Secondary | ICD-10-CM | POA: Diagnosis not present

## 2014-12-01 DIAGNOSIS — I10 Essential (primary) hypertension: Secondary | ICD-10-CM | POA: Diagnosis not present

## 2014-12-01 DIAGNOSIS — E119 Type 2 diabetes mellitus without complications: Secondary | ICD-10-CM | POA: Diagnosis not present

## 2014-12-01 DIAGNOSIS — R0789 Other chest pain: Secondary | ICD-10-CM | POA: Diagnosis not present

## 2014-12-10 DIAGNOSIS — E119 Type 2 diabetes mellitus without complications: Secondary | ICD-10-CM | POA: Diagnosis not present

## 2014-12-10 DIAGNOSIS — Z789 Other specified health status: Secondary | ICD-10-CM | POA: Diagnosis not present

## 2014-12-10 DIAGNOSIS — R9431 Abnormal electrocardiogram [ECG] [EKG]: Secondary | ICD-10-CM | POA: Diagnosis not present

## 2014-12-10 DIAGNOSIS — I10 Essential (primary) hypertension: Secondary | ICD-10-CM | POA: Diagnosis not present

## 2014-12-14 DIAGNOSIS — E669 Obesity, unspecified: Secondary | ICD-10-CM | POA: Diagnosis not present

## 2014-12-14 DIAGNOSIS — M5412 Radiculopathy, cervical region: Secondary | ICD-10-CM | POA: Diagnosis not present

## 2014-12-14 DIAGNOSIS — I10 Essential (primary) hypertension: Secondary | ICD-10-CM | POA: Diagnosis not present

## 2014-12-14 DIAGNOSIS — E119 Type 2 diabetes mellitus without complications: Secondary | ICD-10-CM | POA: Diagnosis not present

## 2014-12-14 DIAGNOSIS — N400 Enlarged prostate without lower urinary tract symptoms: Secondary | ICD-10-CM | POA: Diagnosis not present

## 2014-12-28 ENCOUNTER — Other Ambulatory Visit: Payer: Self-pay | Admitting: Family Medicine

## 2015-01-13 DIAGNOSIS — Z6833 Body mass index (BMI) 33.0-33.9, adult: Secondary | ICD-10-CM | POA: Diagnosis not present

## 2015-01-13 DIAGNOSIS — M4802 Spinal stenosis, cervical region: Secondary | ICD-10-CM | POA: Diagnosis not present

## 2015-04-15 DIAGNOSIS — E119 Type 2 diabetes mellitus without complications: Secondary | ICD-10-CM | POA: Diagnosis not present

## 2015-04-15 DIAGNOSIS — M5412 Radiculopathy, cervical region: Secondary | ICD-10-CM | POA: Diagnosis not present

## 2015-04-15 DIAGNOSIS — I10 Essential (primary) hypertension: Secondary | ICD-10-CM | POA: Diagnosis not present

## 2015-04-15 DIAGNOSIS — M79673 Pain in unspecified foot: Secondary | ICD-10-CM | POA: Diagnosis not present

## 2015-04-15 DIAGNOSIS — N400 Enlarged prostate without lower urinary tract symptoms: Secondary | ICD-10-CM | POA: Diagnosis not present

## 2015-04-15 DIAGNOSIS — E785 Hyperlipidemia, unspecified: Secondary | ICD-10-CM | POA: Diagnosis not present

## 2015-04-15 DIAGNOSIS — N529 Male erectile dysfunction, unspecified: Secondary | ICD-10-CM | POA: Diagnosis not present

## 2015-04-15 DIAGNOSIS — E669 Obesity, unspecified: Secondary | ICD-10-CM | POA: Diagnosis not present

## 2015-04-15 DIAGNOSIS — I447 Left bundle-branch block, unspecified: Secondary | ICD-10-CM | POA: Diagnosis not present

## 2015-06-08 DIAGNOSIS — L570 Actinic keratosis: Secondary | ICD-10-CM | POA: Diagnosis not present

## 2015-06-08 DIAGNOSIS — D239 Other benign neoplasm of skin, unspecified: Secondary | ICD-10-CM | POA: Diagnosis not present

## 2015-08-31 DIAGNOSIS — N400 Enlarged prostate without lower urinary tract symptoms: Secondary | ICD-10-CM | POA: Diagnosis not present

## 2015-08-31 DIAGNOSIS — I447 Left bundle-branch block, unspecified: Secondary | ICD-10-CM | POA: Diagnosis not present

## 2015-08-31 DIAGNOSIS — N529 Male erectile dysfunction, unspecified: Secondary | ICD-10-CM | POA: Diagnosis not present

## 2015-08-31 DIAGNOSIS — F40240 Claustrophobia: Secondary | ICD-10-CM | POA: Diagnosis not present

## 2015-08-31 DIAGNOSIS — E119 Type 2 diabetes mellitus without complications: Secondary | ICD-10-CM | POA: Diagnosis not present

## 2015-08-31 DIAGNOSIS — E669 Obesity, unspecified: Secondary | ICD-10-CM | POA: Diagnosis not present

## 2015-08-31 DIAGNOSIS — E785 Hyperlipidemia, unspecified: Secondary | ICD-10-CM | POA: Diagnosis not present

## 2015-08-31 DIAGNOSIS — I10 Essential (primary) hypertension: Secondary | ICD-10-CM | POA: Diagnosis not present

## 2015-09-29 LAB — HM DIABETES EYE EXAM

## 2015-11-08 DIAGNOSIS — L570 Actinic keratosis: Secondary | ICD-10-CM | POA: Diagnosis not present

## 2015-11-30 DIAGNOSIS — L570 Actinic keratosis: Secondary | ICD-10-CM | POA: Diagnosis not present

## 2015-12-28 DIAGNOSIS — L570 Actinic keratosis: Secondary | ICD-10-CM | POA: Diagnosis not present

## 2016-01-18 DIAGNOSIS — Z23 Encounter for immunization: Secondary | ICD-10-CM | POA: Diagnosis not present

## 2016-01-31 DIAGNOSIS — I10 Essential (primary) hypertension: Secondary | ICD-10-CM | POA: Diagnosis not present

## 2016-01-31 DIAGNOSIS — E785 Hyperlipidemia, unspecified: Secondary | ICD-10-CM | POA: Diagnosis not present

## 2016-01-31 DIAGNOSIS — N529 Male erectile dysfunction, unspecified: Secondary | ICD-10-CM | POA: Diagnosis not present

## 2016-01-31 DIAGNOSIS — Z125 Encounter for screening for malignant neoplasm of prostate: Secondary | ICD-10-CM | POA: Diagnosis not present

## 2016-01-31 DIAGNOSIS — N400 Enlarged prostate without lower urinary tract symptoms: Secondary | ICD-10-CM | POA: Diagnosis not present

## 2016-01-31 DIAGNOSIS — E119 Type 2 diabetes mellitus without complications: Secondary | ICD-10-CM | POA: Diagnosis not present

## 2016-01-31 DIAGNOSIS — E669 Obesity, unspecified: Secondary | ICD-10-CM | POA: Diagnosis not present

## 2016-01-31 DIAGNOSIS — R2241 Localized swelling, mass and lump, right lower limb: Secondary | ICD-10-CM | POA: Diagnosis not present

## 2016-01-31 DIAGNOSIS — Z7984 Long term (current) use of oral hypoglycemic drugs: Secondary | ICD-10-CM | POA: Diagnosis not present

## 2016-07-30 DIAGNOSIS — E119 Type 2 diabetes mellitus without complications: Secondary | ICD-10-CM | POA: Diagnosis not present

## 2016-07-30 DIAGNOSIS — E785 Hyperlipidemia, unspecified: Secondary | ICD-10-CM | POA: Diagnosis not present

## 2016-07-30 DIAGNOSIS — I10 Essential (primary) hypertension: Secondary | ICD-10-CM | POA: Diagnosis not present

## 2016-07-30 DIAGNOSIS — N529 Male erectile dysfunction, unspecified: Secondary | ICD-10-CM | POA: Diagnosis not present

## 2016-07-30 DIAGNOSIS — Z7984 Long term (current) use of oral hypoglycemic drugs: Secondary | ICD-10-CM | POA: Diagnosis not present

## 2016-07-30 DIAGNOSIS — N400 Enlarged prostate without lower urinary tract symptoms: Secondary | ICD-10-CM | POA: Diagnosis not present

## 2016-07-30 DIAGNOSIS — Z1211 Encounter for screening for malignant neoplasm of colon: Secondary | ICD-10-CM | POA: Diagnosis not present

## 2016-08-13 DIAGNOSIS — Z1211 Encounter for screening for malignant neoplasm of colon: Secondary | ICD-10-CM | POA: Diagnosis not present

## 2016-12-13 DIAGNOSIS — Z23 Encounter for immunization: Secondary | ICD-10-CM | POA: Diagnosis not present

## 2017-01-29 DIAGNOSIS — Z125 Encounter for screening for malignant neoplasm of prostate: Secondary | ICD-10-CM | POA: Diagnosis not present

## 2017-01-29 DIAGNOSIS — N400 Enlarged prostate without lower urinary tract symptoms: Secondary | ICD-10-CM | POA: Diagnosis not present

## 2017-01-29 DIAGNOSIS — N529 Male erectile dysfunction, unspecified: Secondary | ICD-10-CM | POA: Diagnosis not present

## 2017-01-29 DIAGNOSIS — E785 Hyperlipidemia, unspecified: Secondary | ICD-10-CM | POA: Diagnosis not present

## 2017-01-29 DIAGNOSIS — E119 Type 2 diabetes mellitus without complications: Secondary | ICD-10-CM | POA: Diagnosis not present

## 2017-01-29 DIAGNOSIS — I10 Essential (primary) hypertension: Secondary | ICD-10-CM | POA: Diagnosis not present

## 2017-01-29 DIAGNOSIS — Z7984 Long term (current) use of oral hypoglycemic drugs: Secondary | ICD-10-CM | POA: Diagnosis not present

## 2017-02-04 DIAGNOSIS — Z125 Encounter for screening for malignant neoplasm of prostate: Secondary | ICD-10-CM | POA: Diagnosis not present

## 2017-02-04 DIAGNOSIS — N400 Enlarged prostate without lower urinary tract symptoms: Secondary | ICD-10-CM | POA: Diagnosis not present

## 2017-02-04 DIAGNOSIS — I10 Essential (primary) hypertension: Secondary | ICD-10-CM | POA: Diagnosis not present

## 2017-02-04 DIAGNOSIS — N529 Male erectile dysfunction, unspecified: Secondary | ICD-10-CM | POA: Diagnosis not present

## 2017-02-04 DIAGNOSIS — E785 Hyperlipidemia, unspecified: Secondary | ICD-10-CM | POA: Diagnosis not present

## 2017-02-04 DIAGNOSIS — Z7984 Long term (current) use of oral hypoglycemic drugs: Secondary | ICD-10-CM | POA: Diagnosis not present

## 2017-02-04 DIAGNOSIS — E119 Type 2 diabetes mellitus without complications: Secondary | ICD-10-CM | POA: Diagnosis not present

## 2017-03-19 DIAGNOSIS — M9903 Segmental and somatic dysfunction of lumbar region: Secondary | ICD-10-CM | POA: Diagnosis not present

## 2017-03-19 DIAGNOSIS — M545 Low back pain: Secondary | ICD-10-CM | POA: Diagnosis not present

## 2017-03-20 DIAGNOSIS — M9903 Segmental and somatic dysfunction of lumbar region: Secondary | ICD-10-CM | POA: Diagnosis not present

## 2017-03-20 DIAGNOSIS — M545 Low back pain: Secondary | ICD-10-CM | POA: Diagnosis not present

## 2017-07-26 DIAGNOSIS — N400 Enlarged prostate without lower urinary tract symptoms: Secondary | ICD-10-CM | POA: Diagnosis not present

## 2017-07-26 DIAGNOSIS — N529 Male erectile dysfunction, unspecified: Secondary | ICD-10-CM | POA: Diagnosis not present

## 2017-07-26 DIAGNOSIS — Z7984 Long term (current) use of oral hypoglycemic drugs: Secondary | ICD-10-CM | POA: Diagnosis not present

## 2017-07-26 DIAGNOSIS — E785 Hyperlipidemia, unspecified: Secondary | ICD-10-CM | POA: Diagnosis not present

## 2017-07-26 DIAGNOSIS — E119 Type 2 diabetes mellitus without complications: Secondary | ICD-10-CM | POA: Diagnosis not present

## 2017-07-26 DIAGNOSIS — I10 Essential (primary) hypertension: Secondary | ICD-10-CM | POA: Diagnosis not present

## 2017-08-14 DIAGNOSIS — M9904 Segmental and somatic dysfunction of sacral region: Secondary | ICD-10-CM | POA: Diagnosis not present

## 2017-08-14 DIAGNOSIS — M9903 Segmental and somatic dysfunction of lumbar region: Secondary | ICD-10-CM | POA: Diagnosis not present

## 2017-08-14 DIAGNOSIS — M9905 Segmental and somatic dysfunction of pelvic region: Secondary | ICD-10-CM | POA: Diagnosis not present

## 2017-08-14 DIAGNOSIS — M5137 Other intervertebral disc degeneration, lumbosacral region: Secondary | ICD-10-CM | POA: Diagnosis not present

## 2017-08-15 DIAGNOSIS — M5137 Other intervertebral disc degeneration, lumbosacral region: Secondary | ICD-10-CM | POA: Diagnosis not present

## 2017-08-15 DIAGNOSIS — M9904 Segmental and somatic dysfunction of sacral region: Secondary | ICD-10-CM | POA: Diagnosis not present

## 2017-08-15 DIAGNOSIS — M9905 Segmental and somatic dysfunction of pelvic region: Secondary | ICD-10-CM | POA: Diagnosis not present

## 2017-08-15 DIAGNOSIS — M9903 Segmental and somatic dysfunction of lumbar region: Secondary | ICD-10-CM | POA: Diagnosis not present

## 2017-08-26 DIAGNOSIS — M9903 Segmental and somatic dysfunction of lumbar region: Secondary | ICD-10-CM | POA: Diagnosis not present

## 2017-08-26 DIAGNOSIS — M5442 Lumbago with sciatica, left side: Secondary | ICD-10-CM | POA: Diagnosis not present

## 2017-08-27 DIAGNOSIS — M9903 Segmental and somatic dysfunction of lumbar region: Secondary | ICD-10-CM | POA: Diagnosis not present

## 2017-08-27 DIAGNOSIS — M5442 Lumbago with sciatica, left side: Secondary | ICD-10-CM | POA: Diagnosis not present

## 2017-08-28 DIAGNOSIS — M5442 Lumbago with sciatica, left side: Secondary | ICD-10-CM | POA: Diagnosis not present

## 2017-08-28 DIAGNOSIS — M9903 Segmental and somatic dysfunction of lumbar region: Secondary | ICD-10-CM | POA: Diagnosis not present

## 2017-09-04 DIAGNOSIS — M5442 Lumbago with sciatica, left side: Secondary | ICD-10-CM | POA: Diagnosis not present

## 2017-09-04 DIAGNOSIS — M9903 Segmental and somatic dysfunction of lumbar region: Secondary | ICD-10-CM | POA: Diagnosis not present

## 2017-09-10 DIAGNOSIS — E785 Hyperlipidemia, unspecified: Secondary | ICD-10-CM | POA: Diagnosis not present

## 2017-09-10 DIAGNOSIS — M5106 Intervertebral disc disorders with myelopathy, lumbar region: Secondary | ICD-10-CM | POA: Diagnosis not present

## 2017-09-10 DIAGNOSIS — E119 Type 2 diabetes mellitus without complications: Secondary | ICD-10-CM | POA: Diagnosis not present

## 2017-09-10 DIAGNOSIS — Z7984 Long term (current) use of oral hypoglycemic drugs: Secondary | ICD-10-CM | POA: Diagnosis not present

## 2017-09-10 DIAGNOSIS — N400 Enlarged prostate without lower urinary tract symptoms: Secondary | ICD-10-CM | POA: Diagnosis not present

## 2017-09-10 DIAGNOSIS — I10 Essential (primary) hypertension: Secondary | ICD-10-CM | POA: Diagnosis not present

## 2017-09-10 DIAGNOSIS — N529 Male erectile dysfunction, unspecified: Secondary | ICD-10-CM | POA: Diagnosis not present

## 2017-09-11 DIAGNOSIS — M5416 Radiculopathy, lumbar region: Secondary | ICD-10-CM | POA: Diagnosis not present

## 2017-09-11 DIAGNOSIS — I10 Essential (primary) hypertension: Secondary | ICD-10-CM | POA: Diagnosis not present

## 2017-09-11 DIAGNOSIS — Z6833 Body mass index (BMI) 33.0-33.9, adult: Secondary | ICD-10-CM | POA: Diagnosis not present

## 2017-09-13 ENCOUNTER — Other Ambulatory Visit: Payer: Self-pay | Admitting: Neurological Surgery

## 2017-09-13 DIAGNOSIS — M5416 Radiculopathy, lumbar region: Secondary | ICD-10-CM

## 2017-09-26 ENCOUNTER — Other Ambulatory Visit: Payer: Self-pay | Admitting: Neurological Surgery

## 2017-09-26 ENCOUNTER — Ambulatory Visit
Admission: RE | Admit: 2017-09-26 | Discharge: 2017-09-26 | Disposition: A | Discharge status: Home / Self Care | Payer: Medicare Other | Source: Ambulatory Visit | Attending: Neurological Surgery | Admitting: Neurological Surgery

## 2017-09-26 DIAGNOSIS — M5416 Radiculopathy, lumbar region: Secondary | ICD-10-CM

## 2017-09-26 DIAGNOSIS — M48061 Spinal stenosis, lumbar region without neurogenic claudication: Secondary | ICD-10-CM | POA: Diagnosis not present

## 2017-10-03 DIAGNOSIS — M5416 Radiculopathy, lumbar region: Secondary | ICD-10-CM | POA: Diagnosis not present

## 2017-10-08 ENCOUNTER — Other Ambulatory Visit: Payer: Self-pay | Admitting: Neurological Surgery

## 2017-10-21 ENCOUNTER — Encounter (HOSPITAL_COMMUNITY): Payer: Self-pay

## 2017-10-21 ENCOUNTER — Encounter (HOSPITAL_COMMUNITY)
Admission: RE | Admit: 2017-10-21 | Discharge: 2017-10-21 | Disposition: A | Discharge status: Home / Self Care | Payer: Medicare Other | Source: Ambulatory Visit | Attending: Neurological Surgery | Admitting: Neurological Surgery

## 2017-10-21 DIAGNOSIS — E785 Hyperlipidemia, unspecified: Secondary | ICD-10-CM | POA: Insufficient documentation

## 2017-10-21 DIAGNOSIS — M48062 Spinal stenosis, lumbar region with neurogenic claudication: Secondary | ICD-10-CM | POA: Diagnosis not present

## 2017-10-21 DIAGNOSIS — Z01810 Encounter for preprocedural cardiovascular examination: Secondary | ICD-10-CM | POA: Insufficient documentation

## 2017-10-21 DIAGNOSIS — Z79899 Other long term (current) drug therapy: Secondary | ICD-10-CM | POA: Insufficient documentation

## 2017-10-21 DIAGNOSIS — K219 Gastro-esophageal reflux disease without esophagitis: Secondary | ICD-10-CM | POA: Diagnosis not present

## 2017-10-21 DIAGNOSIS — I10 Essential (primary) hypertension: Secondary | ICD-10-CM | POA: Insufficient documentation

## 2017-10-21 DIAGNOSIS — Z7982 Long term (current) use of aspirin: Secondary | ICD-10-CM | POA: Diagnosis not present

## 2017-10-21 DIAGNOSIS — E119 Type 2 diabetes mellitus without complications: Secondary | ICD-10-CM | POA: Insufficient documentation

## 2017-10-21 DIAGNOSIS — Z01818 Encounter for other preprocedural examination: Secondary | ICD-10-CM | POA: Insufficient documentation

## 2017-10-21 DIAGNOSIS — Z7984 Long term (current) use of oral hypoglycemic drugs: Secondary | ICD-10-CM | POA: Insufficient documentation

## 2017-10-21 DIAGNOSIS — R9431 Abnormal electrocardiogram [ECG] [EKG]: Secondary | ICD-10-CM | POA: Insufficient documentation

## 2017-10-21 DIAGNOSIS — Z23 Encounter for immunization: Secondary | ICD-10-CM | POA: Diagnosis not present

## 2017-10-21 HISTORY — DX: Personal history of other diseases of the digestive system: Z87.19

## 2017-10-21 HISTORY — DX: Spinal stenosis, lumbar region with neurogenic claudication: M48.062

## 2017-10-21 LAB — CBC
HEMATOCRIT: 44.5 % (ref 39.0–52.0)
HEMOGLOBIN: 15.4 g/dL (ref 13.0–17.0)
MCH: 30.6 pg (ref 26.0–34.0)
MCHC: 34.6 g/dL (ref 30.0–36.0)
MCV: 88.3 fL (ref 78.0–100.0)
Platelets: 146 10*3/uL — ABNORMAL LOW (ref 150–400)
RBC: 5.04 MIL/uL (ref 4.22–5.81)
RDW: 13.5 % (ref 11.5–15.5)
WBC: 6.1 10*3/uL (ref 4.0–10.5)

## 2017-10-21 LAB — SURGICAL PCR SCREEN
MRSA, PCR: NEGATIVE
Staphylococcus aureus: POSITIVE — AB

## 2017-10-21 LAB — HEMOGLOBIN A1C
Hgb A1c MFr Bld: 6.3 % — ABNORMAL HIGH (ref 4.8–5.6)
Mean Plasma Glucose: 134.11 mg/dL

## 2017-10-21 LAB — GLUCOSE, CAPILLARY: Glucose-Capillary: 131 mg/dL — ABNORMAL HIGH (ref 65–99)

## 2017-10-21 NOTE — Pre-Procedure Instructions (Signed)
Tadeusz Stahl  10/21/2017      [id number] [place], [place] [address] [place] [address] Phone: [phone] Fax: [phone]  [facility] [place], [place] [address] Phone: [phone] Fax: [phone]  [facility] [place], [place] [address] [place] [address] Phone: [phone] Fax: [phone]    Your procedure is scheduled on Monday, October 28, 2017  Report to [facility] Admitting at 12:40 P.M.  Call this number if you have problems the morning of surgery:  [phone]   Remember:  Do not eat food or drink liquids after midnight Sunday, October 27, 2017  Take these medicines the morning of surgery with A SIP OF WATER : Dutasteride-Tamsulosin,  omeprazole (PRILOSEC) Stop taking Aspirin, all vitamins, Coenzyme Q10, Omega-3 Fatty Acids (Fish oil), GINSENG-COMPLEX and herbal medications. Do not take any NSAIDs ie: Ibuprofen, Advil, Naproxen (Aleve/Anaprox), Motrin, BC and Goody Powder; stop now.    How to Manage Your Diabetes Before and After Surgery  Why is it important to control my blood sugar before and after surgery? . Improving blood sugar levels before and after surgery helps healing and can limit problems. . A way of improving blood sugar control is eating a healthy diet by: o  Eating less sugar and carbohydrates o  Increasing activity/exercise o  Talking with your doctor about reaching your blood sugar goals . High blood sugars (greater than 180 mg/dL) can raise your risk of infections and slow your recovery, so you will need to focus on controlling your diabetes during the weeks before surgery. . Make sure that the doctor who takes care of your diabetes knows about your planned surgery including the date and location.  How do I manage my blood sugar before  surgery? . Check your blood sugar at least 4 times a day, starting 2 days before surgery, to make sure that the level is not too high or low. o Check your blood sugar the morning of your surgery when you wake up and every 2 hours until you get to the Short Stay unit. . If your blood sugar is less than 70 mg/dL, you will need to treat for low blood sugar: o Do not take insulin. o Treat a low blood sugar (less than 70 mg/dL) with  cup of clear juice (cranberry or apple), 4 glucose tablets, OR glucose gel. o Recheck blood sugar in 15 minutes after treatment (to make sure it is greater than 70 mg/dL). If your blood sugar is not greater than 70 mg/dL on recheck, call [phone] for further instructions. . Report your blood sugar to the short stay nurse when you get to Short Stay.  . If you are admitted to the hospital after surgery: o Your blood sugar will be checked by the staff and you will probably be given insulin after surgery (instead of oral diabetes medicines) to make sure you have good blood sugar levels. o The goal for blood sugar control after surgery is 80-180 mg/dL.  WHAT DO I DO ABOUT MY DIABETES MEDICATION?   Marland Kitchen Do not take oral diabetes medicines (pills) the morning of surgery such as sitaGLIPtin-metformin Carlyn Reichert)  Reviewed and Endorsed by [facility] Patient Education Committee, August 2015  Do not wear jewelry, make-up or nail polish.  Do not wear lotions, powders, or perfumes,  or deoderant.  Do not shave 48 hours prior to surgery.  Men may shave face and neck.  Do not bring valuables to the hospital.  [facility] is not responsible for any belongings or valuables.  Contacts, dentures or bridgework may not be worn into surgery.  Leave your suitcase in the car.  After surgery it may be brought to your room. For patients admitted to the hospital, discharge time will be determined by your treatment team. Patients discharged the day of surgery will not be allowed to drive  home.  Special instructions: Shower the night before surgery and the morning of surgery with CHG. Please read over the following fact sheets that you were given. Pain Booklet, Coughing and Deep Breathing, MRSA Information and Surgical Site Infection Prevention

## 2017-10-21 NOTE — Progress Notes (Signed)
[facility]  Cardiologist - Denies  Chest x-ray - Denies  EKG - 10/21/17  Stress Test - Yes- can't remember the date-Requested  ECHO - Denies  Cardiac Cath - Denies  Sleep Study - No  CPAP - None  Fasting Blood Sugar - 100-130, Today 131 Checks Blood Sugar _2-3____ times a week  Chart will be given to anesthesia for review due to requested cardiac records.  Pt denies having chest pain, sob, or fever at this time. All instructions explained to the pt, with a verbal understanding of the material. Pt agrees to go over the instructions while at home for a better understanding. The opportunity to ask questions was provided.

## 2017-10-24 NOTE — Progress Notes (Signed)
Anesthesia Chart Review:  Pt is a 66 year old male scheduled for L3-4, L4-5, L5-S1 laminotomy/foraminotomy on 10/28/2017 with Kristeen Miss, MD  - PCP is Yaakov Guthrie, MD  - Pt saw cardiologist Kela Millin, MD in [phone] for evaluation of chest pain. Echo and stress test ordered, results below.  Chest pain felt not to be cardiac, prn f/u recommended.   PMH includes:  HTN, DM, hyperlipidemia, GERD. Former smoker. BMI 33  Medications include: ASA 81mg , lisinopril, prilosec, pitavastatin, janumet, cialis  BP (!) 156/87   Pulse 65   Temp 36.4 C   Resp 20   Ht 5\' 11"  (1.803 m)   Wt 234 lb 9.6 oz (106.4 kg)   SpO2 96%   BMI 32.72 kg/m   Preoperative labs reviewed.   - HbA1c 6.3 - BMET will be obtained day of surgery.   EKG 10/21/17: NSR. LAD  Echo 12/01/14 [facility] cardiovascular):  1. LV cavity normal in size. Mild concentric LVH. Normal global wall motion. No segmental wall motion abnormalities seen. Grade I diastolic dysfunction with elevated LV filling pressure. Calculated EF 65%. 2. LA cavity mildly dilated. 3. Structurally normal mitral valve with trace regurgitation. 4. Structurally normal tricuspid valve with trace regurgitation.  Nuclear stress test 11/19/14 [facility]):  1. Resting EKG demonstrates NSR, incomplete RBBB, and no resting arrhythmias. Poor R-wave progression, pulmonary disease pattern. Stress EKG normal, no evidence of ischemia. Achieved 7.5 METS. Stress test terminated because of fatigue and achievement of THR 2. Perfusion imaging study demonstrates mild diaphragmatic attenuation artifact in the inferior wall and apical wall. Mild ischemia cannot be completely excluded. Dynamic gated images reveal normal wall motion and EF normal at 51%. This is a low risk study.  If labs acceptable day of surgery, I anticipate pt can proceed with surgery as scheduled.   Willeen Cass, FNP-BC [facility]/Anesthesiology Phone:  [phone] 10/24/2017 2:18 PM

## 2017-10-28 ENCOUNTER — Inpatient Hospital Stay (HOSPITAL_COMMUNITY): Payer: Medicare Other | Admitting: Certified Registered"

## 2017-10-28 ENCOUNTER — Inpatient Hospital Stay (HOSPITAL_COMMUNITY): Payer: Medicare Other

## 2017-10-28 ENCOUNTER — Inpatient Hospital Stay (HOSPITAL_COMMUNITY): Payer: Medicare Other | Admitting: Emergency Medicine

## 2017-10-28 ENCOUNTER — Encounter (HOSPITAL_COMMUNITY): Payer: Self-pay | Admitting: Certified Registered Nurse Anesthetist

## 2017-10-28 ENCOUNTER — Encounter (HOSPITAL_COMMUNITY)
Admission: RE | Disposition: A | Discharge status: Home / Self Care | Payer: Self-pay | Source: Ambulatory Visit | Attending: Neurological Surgery

## 2017-10-28 ENCOUNTER — Observation Stay (HOSPITAL_COMMUNITY)
Admission: RE | Admit: 2017-10-28 | Discharge: 2017-10-29 | Disposition: A | Discharge status: Home / Self Care | Payer: Medicare Other | Source: Ambulatory Visit | Attending: Neurological Surgery | Admitting: Neurological Surgery

## 2017-10-28 DIAGNOSIS — Z79899 Other long term (current) drug therapy: Secondary | ICD-10-CM | POA: Diagnosis not present

## 2017-10-28 DIAGNOSIS — M48061 Spinal stenosis, lumbar region without neurogenic claudication: Secondary | ICD-10-CM | POA: Diagnosis not present

## 2017-10-28 DIAGNOSIS — M549 Dorsalgia, unspecified: Secondary | ICD-10-CM | POA: Diagnosis present

## 2017-10-28 DIAGNOSIS — Z87891 Personal history of nicotine dependence: Secondary | ICD-10-CM | POA: Insufficient documentation

## 2017-10-28 DIAGNOSIS — M5416 Radiculopathy, lumbar region: Secondary | ICD-10-CM | POA: Diagnosis not present

## 2017-10-28 DIAGNOSIS — E119 Type 2 diabetes mellitus without complications: Secondary | ICD-10-CM | POA: Diagnosis not present

## 2017-10-28 DIAGNOSIS — Z7984 Long term (current) use of oral hypoglycemic drugs: Secondary | ICD-10-CM | POA: Insufficient documentation

## 2017-10-28 DIAGNOSIS — E785 Hyperlipidemia, unspecified: Secondary | ICD-10-CM | POA: Diagnosis not present

## 2017-10-28 DIAGNOSIS — I10 Essential (primary) hypertension: Secondary | ICD-10-CM | POA: Diagnosis not present

## 2017-10-28 DIAGNOSIS — K219 Gastro-esophageal reflux disease without esophagitis: Secondary | ICD-10-CM | POA: Insufficient documentation

## 2017-10-28 DIAGNOSIS — M48062 Spinal stenosis, lumbar region with neurogenic claudication: Secondary | ICD-10-CM | POA: Diagnosis not present

## 2017-10-28 DIAGNOSIS — Z419 Encounter for procedure for purposes other than remedying health state, unspecified: Secondary | ICD-10-CM

## 2017-10-28 HISTORY — PX: LUMBAR LAMINECTOMY/DECOMPRESSION MICRODISCECTOMY: SHX5026

## 2017-10-28 LAB — BASIC METABOLIC PANEL
Anion gap: 9 (ref 5–15)
BUN: 26 mg/dL — ABNORMAL HIGH (ref 6–20)
CHLORIDE: 105 mmol/L (ref 101–111)
CO2: 23 mmol/L (ref 22–32)
CREATININE: 0.93 mg/dL (ref 0.61–1.24)
Calcium: 9.3 mg/dL (ref 8.9–10.3)
Glucose, Bld: 96 mg/dL (ref 65–99)
POTASSIUM: 3.8 mmol/L (ref 3.5–5.1)
SODIUM: 137 mmol/L (ref 135–145)

## 2017-10-28 LAB — GLUCOSE, CAPILLARY
GLUCOSE-CAPILLARY: 123 mg/dL — AB (ref 65–99)
Glucose-Capillary: 111 mg/dL — ABNORMAL HIGH (ref 65–99)

## 2017-10-28 SURGERY — LUMBAR LAMINECTOMY/DECOMPRESSION MICRODISCECTOMY 3 LEVELS
Anesthesia: General | Site: Back | Laterality: Bilateral

## 2017-10-28 MED ORDER — SENNA 8.6 MG PO TABS
1.0000 | ORAL_TABLET | Freq: Two times a day (BID) | ORAL | Status: DC
  Administered 2017-10-28 – 2017-10-29 (×2): 8.6 mg via ORAL
  Filled 2017-10-28 (×2): qty 1

## 2017-10-28 MED ORDER — LIDOCAINE-EPINEPHRINE 1 %-1:100000 IJ SOLN
INTRAMUSCULAR | Status: DC | PRN
  Administered 2017-10-28: 5 mL

## 2017-10-28 MED ORDER — THROMBIN (RECOMBINANT) 5000 UNITS EX SOLR
OROMUCOSAL | Status: DC | PRN
  Administered 2017-10-28: 16:00:00 via TOPICAL

## 2017-10-28 MED ORDER — DEXTROSE 5 % IV SOLN
500.0000 mg | Freq: Four times a day (QID) | INTRAVENOUS | Status: DC | PRN
  Filled 2017-10-28: qty 5

## 2017-10-28 MED ORDER — BUPIVACAINE HCL (PF) 0.5 % IJ SOLN
INTRAMUSCULAR | Status: DC | PRN
  Administered 2017-10-28: 5 mL

## 2017-10-28 MED ORDER — VANCOMYCIN HCL IN DEXTROSE 1-5 GM/200ML-% IV SOLN
INTRAVENOUS | Status: AC
  Filled 2017-10-28: qty 200

## 2017-10-28 MED ORDER — LIDOCAINE-EPINEPHRINE 1 %-1:100000 IJ SOLN
INTRAMUSCULAR | Status: AC
  Filled 2017-10-28: qty 1

## 2017-10-28 MED ORDER — LINAGLIPTIN 5 MG PO TABS
5.0000 mg | ORAL_TABLET | Freq: Every day | ORAL | Status: DC
  Administered 2017-10-29: 5 mg via ORAL
  Filled 2017-10-28: qty 1

## 2017-10-28 MED ORDER — FLEET ENEMA 7-19 GM/118ML RE ENEM: 1.0000 | ENEMA | Freq: Once | RECTAL | Status: DC | PRN

## 2017-10-28 MED ORDER — MORPHINE SULFATE (PF) 4 MG/ML IV SOLN
2.0000 mg | INTRAVENOUS | Status: DC | PRN
Start: 2017-10-28 — End: 2017-10-29

## 2017-10-28 MED ORDER — LIDOCAINE 2% (20 MG/ML) 5 ML SYRINGE
INTRAMUSCULAR | Status: AC
  Filled 2017-10-28: qty 5

## 2017-10-28 MED ORDER — OXYCODONE HCL 5 MG/5ML PO SOLN: 5.0000 mg | Freq: Once | ORAL | Status: DC | PRN

## 2017-10-28 MED ORDER — BISACODYL 10 MG RE SUPP: 10.0000 mg | Freq: Every day | RECTAL | Status: DC | PRN

## 2017-10-28 MED ORDER — LIDOCAINE HCL (CARDIAC) 20 MG/ML IV SOLN
INTRAVENOUS | Status: DC | PRN
  Administered 2017-10-28: 80 mg via INTRAVENOUS

## 2017-10-28 MED ORDER — THROMBIN (RECOMBINANT) 5000 UNITS EX SOLR
CUTANEOUS | Status: DC | PRN
  Administered 2017-10-28: 10000 [IU] via TOPICAL

## 2017-10-28 MED ORDER — MIDAZOLAM HCL 5 MG/5ML IJ SOLN
INTRAMUSCULAR | Status: DC | PRN
  Administered 2017-10-28: 2 mg via INTRAVENOUS

## 2017-10-28 MED ORDER — THROMBIN (RECOMBINANT) 5000 UNITS EX SOLR
CUTANEOUS | Status: AC
  Filled 2017-10-28: qty 10000

## 2017-10-28 MED ORDER — SODIUM CHLORIDE 0.9% FLUSH: 3.0000 mL | Freq: Two times a day (BID) | INTRAVENOUS | Status: DC

## 2017-10-28 MED ORDER — ARTIFICIAL TEARS OPHTHALMIC OINT
TOPICAL_OINTMENT | OPHTHALMIC | Status: DC | PRN
  Administered 2017-10-28: 1 via OPHTHALMIC

## 2017-10-28 MED ORDER — HYDROCODONE-ACETAMINOPHEN 5-325 MG PO TABS
1.0000 | ORAL_TABLET | ORAL | Status: DC | PRN
  Filled 2017-10-28: qty 1

## 2017-10-28 MED ORDER — BACITRACIN 50000 UNITS IM SOLR
INTRAMUSCULAR | Status: DC | PRN
  Administered 2017-10-28: 16:00:00

## 2017-10-28 MED ORDER — HYDROMORPHONE HCL 1 MG/ML IJ SOLN: 0.2500 mg | INTRAMUSCULAR | Status: DC | PRN

## 2017-10-28 MED ORDER — DUTASTERIDE-TAMSULOSIN HCL 0.5-0.4 MG PO CAPS: 1.0000 | ORAL_CAPSULE | Freq: Every day | ORAL | Status: DC

## 2017-10-28 MED ORDER — PROPOFOL 10 MG/ML IV BOLUS
INTRAVENOUS | Status: DC | PRN
  Administered 2017-10-28: 180 mg via INTRAVENOUS

## 2017-10-28 MED ORDER — ONDANSETRON HCL 4 MG/2ML IJ SOLN
INTRAMUSCULAR | Status: AC
  Filled 2017-10-28: qty 2

## 2017-10-28 MED ORDER — ROCURONIUM BROMIDE 10 MG/ML (PF) SYRINGE
PREFILLED_SYRINGE | INTRAVENOUS | Status: AC
  Filled 2017-10-28: qty 5

## 2017-10-28 MED ORDER — ONDANSETRON HCL 4 MG/2ML IJ SOLN
INTRAMUSCULAR | Status: DC | PRN
  Administered 2017-10-28: 4 mg via INTRAVENOUS

## 2017-10-28 MED ORDER — SODIUM CHLORIDE 0.9 % IV SOLN
250.0000 mL | INTRAVENOUS | Status: DC
Start: 2017-10-28 — End: 2017-10-29

## 2017-10-28 MED ORDER — TAMSULOSIN HCL 0.4 MG PO CAPS
0.4000 mg | ORAL_CAPSULE | Freq: Every day | ORAL | Status: DC
  Administered 2017-10-29: 0.4 mg via ORAL
  Filled 2017-10-28: qty 1

## 2017-10-28 MED ORDER — PROPOFOL 10 MG/ML IV BOLUS
INTRAVENOUS | Status: AC
  Filled 2017-10-28: qty 20

## 2017-10-28 MED ORDER — THROMBIN (RECOMBINANT) 5000 UNITS EX SOLR
CUTANEOUS | Status: AC
  Filled 2017-10-28: qty 5000

## 2017-10-28 MED ORDER — LISINOPRIL 5 MG PO TABS
5.0000 mg | ORAL_TABLET | Freq: Every day | ORAL | Status: DC
  Administered 2017-10-29: 5 mg via ORAL
  Filled 2017-10-28 (×3): qty 1

## 2017-10-28 MED ORDER — ARTIFICIAL TEARS OPHTHALMIC OINT
TOPICAL_OINTMENT | OPHTHALMIC | Status: AC
  Filled 2017-10-28: qty 3.5

## 2017-10-28 MED ORDER — METHOCARBAMOL 500 MG PO TABS: 500.0000 mg | ORAL_TABLET | Freq: Two times a day (BID) | ORAL | Status: DC | PRN

## 2017-10-28 MED ORDER — VANCOMYCIN HCL 10 G IV SOLR
1500.0000 mg | INTRAVENOUS | Status: AC
  Administered 2017-10-28: 1500 g via INTRAVENOUS
  Filled 2017-10-28: qty 1500

## 2017-10-28 MED ORDER — METHOCARBAMOL 500 MG PO TABS: 500.0000 mg | ORAL_TABLET | Freq: Four times a day (QID) | ORAL | Status: DC | PRN

## 2017-10-28 MED ORDER — DEXAMETHASONE SODIUM PHOSPHATE 10 MG/ML IJ SOLN
INTRAMUSCULAR | Status: AC
  Filled 2017-10-28: qty 1

## 2017-10-28 MED ORDER — VANCOMYCIN HCL 10 G IV SOLR
1500.0000 mg | Freq: Once | INTRAVENOUS | Status: AC
  Administered 2017-10-29: 1500 mg via INTRAVENOUS
  Filled 2017-10-28: qty 1500

## 2017-10-28 MED ORDER — DUTASTERIDE 0.5 MG PO CAPS
0.5000 mg | ORAL_CAPSULE | Freq: Every day | ORAL | Status: DC
  Administered 2017-10-29: 0.5 mg via ORAL
  Filled 2017-10-28: qty 1

## 2017-10-28 MED ORDER — HYDROCODONE-ACETAMINOPHEN 5-325 MG PO TABS: 2.0000 | ORAL_TABLET | ORAL | Status: DC | PRN

## 2017-10-28 MED ORDER — SUGAMMADEX SODIUM 200 MG/2ML IV SOLN
INTRAVENOUS | Status: AC
  Filled 2017-10-28: qty 2

## 2017-10-28 MED ORDER — ONDANSETRON HCL 4 MG/2ML IJ SOLN: 4.0000 mg | Freq: Four times a day (QID) | INTRAMUSCULAR | Status: DC | PRN

## 2017-10-28 MED ORDER — SITAGLIPTIN PHOS-METFORMIN HCL 50-1000 MG PO TABS: 0.5000 | ORAL_TABLET | Freq: Every day | ORAL | Status: DC

## 2017-10-28 MED ORDER — VANCOMYCIN HCL IN DEXTROSE 1-5 GM/200ML-% IV SOLN: 1000.0000 mg | INTRAVENOUS | Status: DC

## 2017-10-28 MED ORDER — LACTATED RINGERS IV SOLN
INTRAVENOUS | Status: DC
  Administered 2017-10-28: 50 mL/h via INTRAVENOUS

## 2017-10-28 MED ORDER — ROCURONIUM BROMIDE 100 MG/10ML IV SOLN
INTRAVENOUS | Status: DC | PRN
  Administered 2017-10-28: 50 mg via INTRAVENOUS
  Administered 2017-10-28: 20 mg via INTRAVENOUS

## 2017-10-28 MED ORDER — BUPIVACAINE HCL (PF) 0.5 % IJ SOLN
INTRAMUSCULAR | Status: AC
  Filled 2017-10-28: qty 30

## 2017-10-28 MED ORDER — ACETAMINOPHEN 650 MG RE SUPP: 650.0000 mg | RECTAL | Status: DC | PRN

## 2017-10-28 MED ORDER — ONDANSETRON HCL 4 MG PO TABS: 4.0000 mg | ORAL_TABLET | Freq: Four times a day (QID) | ORAL | Status: DC | PRN

## 2017-10-28 MED ORDER — FENTANYL CITRATE (PF) 250 MCG/5ML IJ SOLN
INTRAMUSCULAR | Status: AC
  Filled 2017-10-28: qty 5

## 2017-10-28 MED ORDER — FENTANYL CITRATE (PF) 100 MCG/2ML IJ SOLN
INTRAMUSCULAR | Status: DC | PRN
  Administered 2017-10-28 (×2): 50 ug via INTRAVENOUS
  Administered 2017-10-28: 100 ug via INTRAVENOUS

## 2017-10-28 MED ORDER — MIDAZOLAM HCL 2 MG/2ML IJ SOLN
INTRAMUSCULAR | Status: AC
  Filled 2017-10-28: qty 2

## 2017-10-28 MED ORDER — 0.9 % SODIUM CHLORIDE (POUR BTL) OPTIME
TOPICAL | Status: DC | PRN
  Administered 2017-10-28: 1000 mL

## 2017-10-28 MED ORDER — PANTOPRAZOLE SODIUM 40 MG PO TBEC
40.0000 mg | DELAYED_RELEASE_TABLET | Freq: Every day | ORAL | Status: DC
Start: 2017-10-29 — End: 2017-10-29
  Administered 2017-10-29: 40 mg via ORAL
  Filled 2017-10-28 (×2): qty 1

## 2017-10-28 MED ORDER — SODIUM CHLORIDE 0.9% FLUSH: 3.0000 mL | INTRAVENOUS | Status: DC | PRN

## 2017-10-28 MED ORDER — DOCUSATE SODIUM 100 MG PO CAPS
100.0000 mg | ORAL_CAPSULE | Freq: Two times a day (BID) | ORAL | Status: DC
  Administered 2017-10-28 – 2017-10-29 (×2): 100 mg via ORAL
  Filled 2017-10-28 (×2): qty 1

## 2017-10-28 MED ORDER — POLYETHYLENE GLYCOL 3350 17 G PO PACK: 17.0000 g | PACK | Freq: Every day | ORAL | Status: DC | PRN

## 2017-10-28 MED ORDER — CHLORHEXIDINE GLUCONATE CLOTH 2 % EX PADS: 6.0000 | MEDICATED_PAD | Freq: Once | CUTANEOUS | Status: DC

## 2017-10-28 MED ORDER — OXYCODONE HCL 5 MG PO TABS: 5.0000 mg | ORAL_TABLET | Freq: Once | ORAL | Status: DC | PRN

## 2017-10-28 MED ORDER — ACETAMINOPHEN 325 MG PO TABS: 650.0000 mg | ORAL_TABLET | ORAL | Status: DC | PRN

## 2017-10-28 MED ORDER — METFORMIN HCL 500 MG PO TABS
500.0000 mg | ORAL_TABLET | Freq: Every day | ORAL | Status: DC
  Administered 2017-10-29: 500 mg via ORAL
  Filled 2017-10-28: qty 1

## 2017-10-28 MED ORDER — SODIUM CHLORIDE 0.9 % IV SOLN
INTRAVENOUS | Status: DC | PRN
  Administered 2017-10-28: 16:00:00 via INTRAVENOUS

## 2017-10-28 MED ORDER — DEXAMETHASONE SODIUM PHOSPHATE 10 MG/ML IJ SOLN
INTRAMUSCULAR | Status: DC | PRN
  Administered 2017-10-28: 10 mg via INTRAVENOUS

## 2017-10-28 MED ORDER — SUGAMMADEX SODIUM 200 MG/2ML IV SOLN
INTRAVENOUS | Status: DC | PRN
  Administered 2017-10-28: 200 mg via INTRAVENOUS

## 2017-10-28 MED ORDER — MENTHOL 3 MG MT LOZG: 1.0000 | LOZENGE | OROMUCOSAL | Status: DC | PRN

## 2017-10-28 MED ORDER — PHENOL 1.4 % MT LIQD: 1.0000 | OROMUCOSAL | Status: DC | PRN

## 2017-10-28 SURGICAL SUPPLY — 50 items
BAG DECANTER FOR FLEXI CONT (MISCELLANEOUS) ×3 IMPLANT
BLADE CLIPPER SURG (BLADE) IMPLANT
BUR ACORN 6.0 (BURR) ×2 IMPLANT
BUR ACORN 6.0MM (BURR) ×1
BUR MATCHSTICK NEURO 3.0 LAGG (BURR) IMPLANT
CANISTER SUCT 3000ML PPV (MISCELLANEOUS) ×3 IMPLANT
CARTRIDGE OIL MAESTRO DRILL (MISCELLANEOUS) ×1 IMPLANT
DECANTER SPIKE VIAL GLASS SM (MISCELLANEOUS) ×3 IMPLANT
DERMABOND ADVANCED (GAUZE/BANDAGES/DRESSINGS) ×2
DERMABOND ADVANCED .7 DNX12 (GAUZE/BANDAGES/DRESSINGS) ×1 IMPLANT
DEVICE DISSECT PLASMABLAD 3.0S (MISCELLANEOUS) ×1 IMPLANT
DIFFUSER DRILL AIR PNEUMATIC (MISCELLANEOUS) ×3 IMPLANT
DRAPE HALF SHEET 40X57 (DRAPES) ×3 IMPLANT
DRAPE LAPAROTOMY 100X72X124 (DRAPES) ×3 IMPLANT
DRAPE MICROSCOPE LEICA (MISCELLANEOUS) IMPLANT
DRAPE POUCH INSTRU U-SHP 10X18 (DRAPES) ×3 IMPLANT
DRSG OPSITE POSTOP 4X6 (GAUZE/BANDAGES/DRESSINGS) ×3 IMPLANT
DURAPREP 26ML APPLICATOR (WOUND CARE) ×3 IMPLANT
ELECT REM PT RETURN 9FT ADLT (ELECTROSURGICAL) ×3
ELECTRODE REM PT RTRN 9FT ADLT (ELECTROSURGICAL) ×1 IMPLANT
GAUZE SPONGE 4X4 12PLY STRL (GAUZE/BANDAGES/DRESSINGS) ×3 IMPLANT
GAUZE SPONGE 4X4 16PLY XRAY LF (GAUZE/BANDAGES/DRESSINGS) IMPLANT
GLOVE BIOGEL PI IND STRL 8.5 (GLOVE) ×1 IMPLANT
GLOVE BIOGEL PI INDICATOR 8.5 (GLOVE) ×2
GLOVE ECLIPSE 8.5 STRL (GLOVE) ×3 IMPLANT
GOWN STRL REUS W/ TWL LRG LVL3 (GOWN DISPOSABLE) IMPLANT
GOWN STRL REUS W/ TWL XL LVL3 (GOWN DISPOSABLE) IMPLANT
GOWN STRL REUS W/TWL 2XL LVL3 (GOWN DISPOSABLE) ×3 IMPLANT
GOWN STRL REUS W/TWL LRG LVL3 (GOWN DISPOSABLE)
GOWN STRL REUS W/TWL XL LVL3 (GOWN DISPOSABLE)
HEMOSTAT POWDER KIT SURGIFOAM (HEMOSTASIS) ×3 IMPLANT
KIT BASIN OR (CUSTOM PROCEDURE TRAY) ×3 IMPLANT
KIT ROOM TURNOVER OR (KITS) ×3 IMPLANT
NEEDLE HYPO 22GX1.5 SAFETY (NEEDLE) ×3 IMPLANT
NEEDLE SPNL 20GX3.5 QUINCKE YW (NEEDLE) IMPLANT
NS IRRIG 1000ML POUR BTL (IV SOLUTION) ×3 IMPLANT
OIL CARTRIDGE MAESTRO DRILL (MISCELLANEOUS) ×3
PACK LAMINECTOMY NEURO (CUSTOM PROCEDURE TRAY) ×3 IMPLANT
PAD ARMBOARD 7.5X6 YLW CONV (MISCELLANEOUS) ×9 IMPLANT
PATTIES SURGICAL .5 X1 (DISPOSABLE) ×3 IMPLANT
PLASMABLADE 3.0S (MISCELLANEOUS) ×3
RUBBERBAND STERILE (MISCELLANEOUS) IMPLANT
SPONGE SURGIFOAM ABS GEL SZ50 (HEMOSTASIS) ×3 IMPLANT
SUT VIC AB 1 CT1 18XBRD ANBCTR (SUTURE) ×1 IMPLANT
SUT VIC AB 1 CT1 8-18 (SUTURE) ×2
SUT VIC AB 2-0 CP2 18 (SUTURE) ×3 IMPLANT
SUT VIC AB 3-0 SH 8-18 (SUTURE) ×6 IMPLANT
TOWEL GREEN STERILE (TOWEL DISPOSABLE) ×3 IMPLANT
TOWEL GREEN STERILE FF (TOWEL DISPOSABLE) ×3 IMPLANT
WATER STERILE IRR 1000ML POUR (IV SOLUTION) ×3 IMPLANT

## 2017-10-28 NOTE — H&P (Signed)
CHIEF COMPLAINT: Back and left lower extremity pain that started suddenly 4 weeks ago.  HISTORY OF PRESENT ILLNESS: [name] is a 66 year old individual whom I have seen in the past.  Two years ago, I evaluated him for some cervical radicular pain, but he tells me that in 1992 I did a back surgery.  He notes that the pain that he incurred rather spontaneously 4 weeks ago is very similar to the pain he had previously.  He notes the pain radiates from the back through the buttock into the left lower extremity.  He has been dealing with this pain, but saw Dr. Jacelyn Grip, who gave him some medication in the form of oxycodone for pain.  He is diabetic, so he has not been placed on any steroid medication, but he is seen now for further evaluation of this process.  He has not had any imaging studies.  The last studies that we have are of the cervical spine that I evaluated back in 2016.  EXAMINATION: I note that he stands straight and erect with some difficulty, but he tends to favor a slight forward stoop when he walks.  His motor function is good in the iliopsoas and the quadriceps, but his tibialis anterior on the left seems to have a little give-way when he walks onto his heels.  His deep tendon reflexes are absent in the patellae, and straight leg raising is markedly positive on the right side at 15 degrees.  Impression:  Michael Tapia  had his MRI of the lumbar spine.  I reviewed this study and compared it to the plain radiographs that we have.  He has some advanced spondylitic stenosis at multiple levels L3-4, L4-5 and L5-S1.  The singular worst of these is L4-5 where he has congenitally short pedicles significant facet hypertrophy, moderate amount of disc degeneration combining to form central canal and lateral recess stenosis.  At L5-S1, he has had previous surgery on the left side where he had a herniated nucleus pulposus.  He has developed some facet overgrowth at this joint which also combines to  form lateral recess stenosis.  At L3-4 he has the mildest of the conditions with some moderate facet hypertrophy causing some mild stenosis.  I noted to Michael Tapia that ultimately I believe he needs to have his spine decompressed and I would advise a decompression at L3-4, 4-5, and 5-1 to be done bilaterally.  This typically requires an overnight stay in the hospital.  The surgery itself involves about a 4 inch incision on his lumbar spine.   Afterwards, generally it takes about 8-10 weeks to heal, but he should hopefully start feeling better with his back and his legs within the week or 2 of surgery.  The biggest risks of the surgery were that of a spinal fluid leak, particularly at the level of L5-S1 where he has had previous surgery and there is likely to be some scar tissue.  That not withstanding, I believe that the surgery should give him a good chance of getting relief of the worst of his symptoms.  Unfortunately, he does have some arthritis in his back which is likely to cause some ongoing back pain, but the neuropathic symptoms that he is experiencing should hopefully be relieved substantially with the surgery.  He is admitted for surgery.

## 2017-10-28 NOTE — Anesthesia Preprocedure Evaluation (Signed)
Anesthesia Evaluation  Patient identified by MRN, date of birth, ID band Patient awake    Reviewed: Allergy & Precautions, H&P , NPO status , Patient's Chart, lab work & pertinent test results  Airway Mallampati: II   Neck ROM: full    Dental   Pulmonary former smoker,    breath sounds clear to auscultation       Cardiovascular hypertension,  Rhythm:regular Rate:Normal     Neuro/Psych    GI/Hepatic hiatal hernia, GERD  ,  Endo/Other  diabetes, Type 2  Renal/GU      Musculoskeletal  (+) Arthritis ,   Abdominal   Peds  Hematology   Anesthesia Other Findings   Reproductive/Obstetrics                             Anesthesia Physical Anesthesia Plan  ASA: II  Anesthesia Plan: General   Post-op Pain Management:    Induction: Intravenous  PONV Risk Score and Plan: 2 and Ondansetron and Dexamethasone  Airway Management Planned: Oral ETT  Additional Equipment:   Intra-op Plan:   Post-operative Plan: Extubation in OR  Informed Consent: I have reviewed the patients History and Physical, chart, labs and discussed the procedure including the risks, benefits and alternatives for the proposed anesthesia with the patient or authorized representative who has indicated his/her understanding and acceptance.     Plan Discussed with: CRNA, Anesthesiologist and Surgeon  Anesthesia Plan Comments:         Anesthesia Quick Evaluation

## 2017-10-28 NOTE — Anesthesia Procedure Notes (Signed)
Procedure Name: Intubation Date/Time: 10/28/2017 3:36 PM Performed by: Candis Shine Pre-anesthesia Checklist: Patient identified, Emergency Drugs available, Suction available and Patient being monitored Patient Re-evaluated:Patient Re-evaluated prior to induction Oxygen Delivery Method: Circle System Utilized Preoxygenation: Pre-oxygenation with 100% oxygen Induction Type: IV induction Ventilation: Mask ventilation without difficulty Laryngoscope Size: Mac and 4 Grade View: Grade I Tube type: Oral Tube size: 7.5 mm Number of attempts: 1 Airway Equipment and Method: Stylet Placement Confirmation: ETT inserted through vocal cords under direct vision,  positive ETCO2 and breath sounds checked- equal and bilateral Secured at: 22 cm Tube secured with: Tape Dental Injury: Teeth and Oropharynx as per pre-operative assessment

## 2017-10-28 NOTE — Transfer of Care (Signed)
Immediate Anesthesia Transfer of Care Note  Patient: Michael Tapia  Procedure(s) Performed: Bilateral L3-4 L4-5 L5-S1 Laminotomy/Foraminotomy (Bilateral Back)  Patient Location: PACU  Anesthesia Type:General  Level of Consciousness: awake, alert  and oriented  Airway & Oxygen Therapy: Patient Spontanous Breathing and Patient connected to nasal cannula oxygen  Post-op Assessment: Report given to RN and Post -op Vital signs reviewed and stable  Post vital signs: Reviewed and stable  Last Vitals:  Vitals:   10/28/17 1239 10/28/17 1745  BP: (!) 152/87 139/82  Pulse: 76 74  Resp: 18 (!) 21  Temp: 36.6 C 36.5 C  SpO2: 95% 96%    Last Pain:  Vitals:   10/28/17 1239  TempSrc: Oral      Patients Stated Pain Goal: 3 (22/02/54 2706)  Complications: No apparent anesthesia complications

## 2017-10-29 ENCOUNTER — Encounter (HOSPITAL_COMMUNITY): Payer: Self-pay | Admitting: Neurological Surgery

## 2017-10-29 DIAGNOSIS — M5416 Radiculopathy, lumbar region: Secondary | ICD-10-CM | POA: Diagnosis not present

## 2017-10-29 DIAGNOSIS — Z87891 Personal history of nicotine dependence: Secondary | ICD-10-CM | POA: Diagnosis not present

## 2017-10-29 DIAGNOSIS — M48062 Spinal stenosis, lumbar region with neurogenic claudication: Secondary | ICD-10-CM | POA: Diagnosis not present

## 2017-10-29 DIAGNOSIS — K219 Gastro-esophageal reflux disease without esophagitis: Secondary | ICD-10-CM | POA: Diagnosis not present

## 2017-10-29 DIAGNOSIS — I10 Essential (primary) hypertension: Secondary | ICD-10-CM | POA: Diagnosis not present

## 2017-10-29 DIAGNOSIS — E119 Type 2 diabetes mellitus without complications: Secondary | ICD-10-CM | POA: Diagnosis not present

## 2017-10-29 MED ORDER — HYDROCODONE-ACETAMINOPHEN 5-325 MG PO TABS: 1.0000 | ORAL_TABLET | ORAL | 0 refills | Status: DC | PRN

## 2017-10-29 MED ORDER — METHOCARBAMOL 500 MG PO TABS: 500.0000 mg | ORAL_TABLET | Freq: Four times a day (QID) | ORAL | 0 refills | Status: DC | PRN

## 2017-10-29 NOTE — Discharge Summary (Signed)
Date of admission: 10/28/2017 Date of discharge: 10/29/2017 Admitting diagnosis: Lumbar spondylosis and stenosis L2-3 L3-4 L4-5, lumbar radiculopathy, lumbar stenosis Discharge and final diagnosis: Lumbar spondylosis and stenosis L2-3 L3-4 L4-5, lumbar radiculopathy, lumbar stenosis. Condition on discharge: Improved Hospital course: Patient was admitted to undergo surgical decompression via bilateral laminotomies and foraminotomies at L2-3 3445. Tolerated surgery well. His incision is clean and dry. Is discharged home. Discharge medications: Hydrocodone 05/02/2024, #30 no refills Ethyl carbinol 500 mg #40, no refills.

## 2017-10-29 NOTE — Evaluation (Signed)
Physical Therapy Evaluation and Discharge Patient Details Name: Michael Tapia MRN: [id number] DOB: 06-03-1951 Today's Date: 10/29/2017   History of Present Illness  Pt is a 66yo male s/p lumbar laminectomy/decompression, micorodiscetomy 3 levels. Pt with h/o of previous back surgery.  Clinical Impression  Patient is s/p above surgery resulting in the deficits listed below (see PT Problem List). Pt able to recall back precautions and is functioning at modified indep level. Pt with no further acute PT needs at this time. Please re-consult if needed in future. PT SIGNING OFF.     Follow Up Recommendations No PT follow up    Equipment Recommendations  None recommended by PT    Recommendations for Other Services       Precautions / Restrictions Precautions Precautions: Back Precaution Booklet Issued: No Precaution Comments: pt able to report BLT from previous surgeries Restrictions Weight Bearing Restrictions: No      Mobility  Bed Mobility Overal bed mobility: Modified Independent Bed Mobility: Rolling;Sidelying to Sit;Sit to Sidelying Rolling: Modified independent (Device/Increase time) Sidelying to sit: Modified independent (Device/Increase time)     Sit to sidelying: Modified independent (Device/Increase time) General bed mobility comments: bed elevated to bed height at home  Transfers Overall transfer level: Independent Equipment used: None Transfers: Sit to/from Stand Sit to Stand: Independent         General transfer comment: pt with no difficulty  Ambulation/Gait Ambulation/Gait assistance: Independent Ambulation Distance (Feet): 250 Feet Assistive device: None Gait Pattern/deviations: WFL(Within Functional Limits) Gait velocity: wfl Gait velocity interpretation: at or above normal speed for age/gender General Gait Details: no difficulty, no antalgia, no AD needed  Stairs Stairs: Yes Stairs assistance: Modified independent (Device/Increase  time) Stair Management: One rail Left;Alternating pattern Number of Stairs: 10 General stair comments: pt able to complete stair negotiation with coffee in R hand  Wheelchair Mobility    Modified Rankin (Stroke Patients Only)       Balance Overall balance assessment: No apparent balance deficits (not formally assessed)                                           Pertinent Vitals/Pain Pain Assessment: Faces Faces Pain Scale: Hurts a little bit Pain Location: surgical site Pain Descriptors / Indicators: Sore Pain Intervention(s): Monitored during session    Home Living Family/patient expects to be discharged to:: Private residence Living Arrangements: Spouse/significant other Available Help at Discharge: Family;Available 24 hours/day Type of Home: House Home Access: Stairs to enter Entrance Stairs-Rails: None Entrance Stairs-Number of Steps: 2 Home Layout: One level Home Equipment: None      Prior Function Level of Independence: Independent               Hand Dominance   Dominant Hand: Right    Extremity/Trunk Assessment   Upper Extremity Assessment Upper Extremity Assessment: Overall WFL for tasks assessed    Lower Extremity Assessment Lower Extremity Assessment: Overall WFL for tasks assessed    Cervical / Trunk Assessment Cervical / Trunk Assessment: Other exceptions Cervical / Trunk Exceptions: back surgery  Communication   Communication: No difficulties  Cognition Arousal/Alertness: Awake/alert Behavior During Therapy: WFL for tasks assessed/performed Overall Cognitive Status: Within Functional Limits for tasks assessed  General Comments      Exercises     Assessment/Plan    PT Assessment Patent does not need any further PT services  PT Problem List         PT Treatment Interventions      PT Goals (Current goals can be found in the Care Plan section)  Acute  Rehab PT Goals Patient Stated Goal: go home today PT Goal Formulation: All assessment and education complete, DC therapy    Frequency     Barriers to discharge        Co-evaluation               AM-PAC PT "6 Clicks" Daily Activity  Outcome Measure Difficulty turning over in bed (including adjusting bedclothes, sheets and blankets)?: None Difficulty moving from lying on back to sitting on the side of the bed? : None Difficulty sitting down on and standing up from a chair with arms (e.g., wheelchair, bedside commode, etc,.)?: None Help needed moving to and from a bed to chair (including a wheelchair)?: None Help needed walking in hospital room?: None Help needed climbing 3-5 steps with a railing? : None 6 Click Score: 24    End of Session Equipment Utilized During Treatment: Gait belt Activity Tolerance: Patient tolerated treatment well Patient left: in chair;with call bell/phone within reach Nurse Communication: Mobility status PT Visit Diagnosis: Unsteadiness on feet (R26.81)    Time: 9150-5697 PT Time Calculation (min) (ACUTE ONLY): 11 min   Charges:   PT Evaluation $PT Eval Low Complexity: 1 Low     PT G Codes:   PT G-Codes **NOT FOR INPATIENT CLASS** Functional Assessment Tool Used: Clinical judgement Functional Limitation: Mobility: Walking and moving around Mobility: Walking and Moving Around Current Status (X4801): At least 1 percent but less than 20 percent impaired, limited or restricted Mobility: Walking and Moving Around Goal Status [phone]): At least 1 percent but less than 20 percent impaired, limited or restricted Mobility: Walking and Moving Around Discharge Status [phone]): At least 1 percent but less than 20 percent impaired, limited or restricted    Kittie Plater, PT, DPT Pager #: [phone] Office #: [phone]   [place] 10/29/2017, 9:00 AM

## 2017-10-29 NOTE — Discharge Instructions (Signed)
Wound Care °Leave incision open to air. °You may shower. °Do not scrub directly on incision.  °Do not put any creams, lotions, or ointments on incision. °Activity °Walk each and every day, increasing distance each day. °No lifting greater than 5 lbs.  Avoid excessive neck motion. °No driving for 2 weeks; may ride as a passenger locally. °Wear neck brace at all times except when showering.  If provided soft collar, may wear for comfort unless otherwise instructed. °Diet °Resume your normal diet.  °Return to Work °Will be discussed at you follow up appointment. °Call Your Doctor If Any of These Occur °Redness, drainage, or swelling at the wound.  °Temperature greater than 101 degrees. °Severe pain not relieved by pain medication. °Increased difficulty swallowing. °Incision starts to come apart. °Follow Up Appt °Call today for appointment in 2 weeks ([phone]) or for problems.  If you have any hardware placed in your spine, you will need an x-ray before your appointment.  °

## 2017-10-29 NOTE — Progress Notes (Signed)
Patient alert and oriented, mae's well, voiding adequate amount of urine, swallowing without difficulty, no c/o pain at time of discharge. Patient discharged home with family. Script and discharged instructions given to patient. Patient and family stated understanding of instructions given. Patient has an appointment with Dr. Elsner  

## 2017-10-29 NOTE — Anesthesia Postprocedure Evaluation (Signed)
Anesthesia Post Note  Patient: Michael Tapia  Procedure(s) Performed: Bilateral L3-4 L4-5 L5-S1 Laminotomy/Foraminotomy (Bilateral Back)     Patient location during evaluation: PACU Anesthesia Type: General Level of consciousness: awake, awake and alert and oriented Pain management: pain level controlled Vital Signs Assessment: post-procedure vital signs reviewed and stable Respiratory status: spontaneous breathing, nonlabored ventilation and respiratory function stable Cardiovascular status: blood pressure returned to baseline Postop Assessment: no headache Anesthetic complications: no    Last Vitals:  Vitals:   10/29/17 0457 10/29/17 0749  BP: 130/69 129/60  Pulse: 77 70  Resp: 16 16  Temp: 36.6 C 36.6 C  SpO2: 95% 97%    Last Pain:  Vitals:   10/29/17 0457  TempSrc: Oral                 Chanee Henrickson COKER

## 2017-10-29 NOTE — Evaluation (Signed)
Occupational Therapy Evaluation Patient Details Name: Michael Tapia MRN: [id number] DOB: Jul 02, 1951 Today's Date: 10/29/2017    History of Present Illness Pt is a 66yo male s/p lumbar laminectomy/decompression, micorodiscetomy 3 levels. Pt with h/o of previous back surgery.   Clinical Impression   PTA, pt was living with wife and was independent. Currently, pt performing ADLs and functional mobility at supervision level with Min VCs to adhere to back precautions. Provided education on back precautions, LB ADLs with AE, and toileting; pt demonstrated and verbalized understanding. Answered all pt questions. Recommend dc home once medically stable per physician. All acute OT needs met and will sign off. Thank you.     Follow Up Recommendations  No OT follow up;Supervision/Assistance - 24 hour    Equipment Recommendations  None recommended by OT    Recommendations for Other Services       Precautions / Restrictions Precautions Precautions: Back Precaution Booklet Issued: No Precaution Comments: pt able to report BLT from previous surgeries Required Braces or Orthoses: Other Brace/Splint Other Brace/Splint: No brace per MD order Restrictions Weight Bearing Restrictions: No      Mobility Bed Mobility Overal bed mobility: Modified Independent Bed Mobility: Rolling;Sidelying to Sit;Sit to Sidelying Rolling: Modified independent (Device/Increase time) Sidelying to sit: Modified independent (Device/Increase time)     Sit to sidelying: Modified independent (Device/Increase time) General bed mobility comments: bed elevated to bed height at home  Transfers Overall transfer level: Independent Equipment used: None Transfers: Sit to/from Stand Sit to Stand: Independent         General transfer comment: pt with no difficulty    Balance Overall balance assessment: No apparent balance deficits (not formally assessed)                                          ADL either performed or assessed with clinical judgement   ADL Overall ADL's : Needs assistance/impaired Eating/Feeding: Set up;Sitting   Grooming: Set up;Supervision/safety;Standing   Upper Body Bathing: Set up;Supervision/ safety;Sitting   Lower Body Bathing: Set up;Supervison/ safety;Sit to/from stand   Upper Body Dressing : Set up;Supervision/safety;Standing   Lower Body Dressing: With adaptive equipment;Sit to/from stand;Set up;Supervision/safety   Toilet Transfer: Set up;Supervision/safety;Ambulation (Simulated in room)     Toileting - Clothing Manipulation Details (indicate cue type and reason): Discussed back precautions during toilet hygiene. Pt verbalizing understanding     Functional mobility during ADLs: Supervision/safety General ADL Comments: Pt performing ADLs and fucntional mobiltiy at supervision level. Pt maintaining back precautions throughout session with MIn VCs for reminders during LB ADLs. Feel pt will progress well with time.;     Vision         Perception     Praxis      Pertinent Vitals/Pain Pain Assessment: Faces Faces Pain Scale: Hurts a little bit Pain Location: surgical site Pain Descriptors / Indicators: Sore Pain Intervention(s): Monitored during session;Repositioned     Hand Dominance Right   Extremity/Trunk Assessment Upper Extremity Assessment Upper Extremity Assessment: Overall WFL for tasks assessed   Lower Extremity Assessment Lower Extremity Assessment: Overall WFL for tasks assessed   Cervical / Trunk Assessment Cervical / Trunk Assessment: Other exceptions Cervical / Trunk Exceptions: back surgery   Communication Communication Communication: No difficulties   Cognition Arousal/Alertness: Awake/alert Behavior During Therapy: WFL for tasks assessed/performed Overall Cognitive Status: Within Functional Limits for tasks assessed  General Comments       Exercises      Shoulder Instructions      Home Living Family/patient expects to be discharged to:: Private residence Living Arrangements: Spouse/significant other Available Help at Discharge: Family;Available 24 hours/day Type of Home: House Home Access: Stairs to enter [employer] of Steps: 2 Entrance Stairs-Rails: None Home Layout: One level     Bathroom Shower/Tub: Occupational psychologist: Handicapped height     Home Equipment: Research scientist (life sciences) (Adjustable bed) Adaptive Equipment: Reacher        Prior Functioning/Environment Level of Independence: Independent                 OT Problem List: Decreased activity tolerance;Decreased range of motion;Decreased knowledge of use of DME or AE;Decreased knowledge of precautions;Pain      OT Treatment/Interventions:      OT Goals(Current goals can be found in the care plan section) Acute Rehab OT Goals Patient Stated Goal: go home today OT Goal Formulation: With patient Time For Goal Achievement: 11/12/17 Potential to Achieve Goals: Good  OT Frequency:     Barriers to D/C:            Co-evaluation              AM-PAC PT "6 Clicks" Daily Activity     Outcome Measure Help from another person eating meals?: None Help from another person taking care of personal grooming?: None Help from another person toileting, which includes using toliet, bedpan, or urinal?: A Little Help from another person bathing (including washing, rinsing, drying)?: A Little Help from another person to put on and taking off regular upper body clothing?: None Help from another person to put on and taking off regular lower body clothing?: A Little 6 Click Score: 21   End of Session Nurse Communication: Mobility status  Activity Tolerance: Patient tolerated treatment well Patient left: in chair;with call bell/phone within reach  OT Visit Diagnosis: Pain Pain - Right/Left:  (Back) Pain - part of body:  (Back)                 Time: 4680-3212 OT Time Calculation (min): 16 min Charges:  OT General Charges $OT Visit: 1 Visit OT Evaluation $OT Eval Low Complexity: 1 Low G-Codes: OT G-codes **NOT FOR INPATIENT CLASS** Functional Assessment Tool Used: Clinical judgement Functional Limitation: Self care Self Care Current Status (Y4825): At least 1 percent but less than 20 percent impaired, limited or restricted Self Care Goal Status (O0370): 0 percent impaired, limited or restricted Self Care Discharge Status [phone]): At least 1 percent but less than 20 percent impaired, limited or restricted   [place], OTR/L Acute Rehab Pager: [phone] Office: [place] 10/29/2017, 9:50 AM

## 2017-10-29 NOTE — Op Note (Signed)
Date of surgery: 10/28/2017 Preoperative diagnosis:lumbar spinal stenosis with radiculopathy and neurogenic claudication, L2-3 L3-4 L4-5 Postoperative diagnosis:same Procedure:bilateral laminotomies L2-3 L3-4 L4-5, decompression of the L2 L3 L4 and L5 nerve roots Surgeon: Kristeen Miss M.D. AssistantCyndy Freeze M.D. Anesthesia: Gen. endotracheal Indications:the patient is a 66 year old individual's had significant lumbar radicular pain in addition to neurogenic claudication. His been advised regarding stenosis that is present at L2-3 L3-4 and L4-5. Is now taken to the operating room to undergo bilateral laminotomies and decompression.  Procedure: Patient was brought to the operating room supine on a stretcher. After the smooth induction of general endotracheal anesthesia he was turned prone onto the operating table. The back was prepped with alcohol and DuraPrep and draped in a sterile fashion. Localizing radiographs identified the interspace at L3-4. A midline incision was created and carried down to the lumbar dorsal fascia which was opened on either side of midline at this level. The dissection was carried out over the interlaminar space and the facet joints atL3-4. A self-retaining retractor was placed in the wound. A high-speed drill was then used to remove the inferior margin of the lamina out to the medial wall the facet performing the initial portion of the dissection. The yellow ligament was then taken up and removed. Common dural tube was identified and dissection was carefully undertaken removing redundant yellow ligament and overgrown facet from the superior facet ofL4and the laminar arch ofL3. A foraminotomy was created over theL4nerve root.superiorly the L3 nerve root was decompressed also. This procedure was carried out bilaterally. Next attention was turned to L4-5 were significant spondylitic stenosis also existed. This was decompressed and the path the L4 nerve root superiorly and the L5  nerve root inferiorly were decompressed individually using a high-speed drill 2 mm Kerrison punch condition to curved and straight curettes. A curved Hairston was also used to help decompress the superior most portion of the nerve root.  Next attention was turned to L2-3 were bilateral laminotomies and foraminotomies were created decompressing the L2 nerve root superiorly and the L3 nerve root inferiorly. Once an adequate decompression was identified and secured, hemostasis and the soft tissues obtained meticulously and when verified retractor was removed the wound was irrigated copiously with antibiotic irrigating solution, and then the lumbar dorsal fascia was closed with #1 Vicryl in interrupted fashion.20 mLOf half percent Marcaine was injected into the paraspinous fascia. 2-0 Vicryl was used to close the subcutaneous fascia and 3-0 Vicryl was used to close the subcuticular skin. Blood loss was estimated as100 mL. The patient was returned to the recovery room in stable condition

## 2017-10-29 NOTE — Progress Notes (Signed)
Vital signs are stable Dressing is clean and dry Motor function is good Patient is comfortable Ready for discharge

## 2018-01-30 DIAGNOSIS — N400 Enlarged prostate without lower urinary tract symptoms: Secondary | ICD-10-CM | POA: Diagnosis not present

## 2018-01-30 DIAGNOSIS — N529 Male erectile dysfunction, unspecified: Secondary | ICD-10-CM | POA: Diagnosis not present

## 2018-01-30 DIAGNOSIS — E785 Hyperlipidemia, unspecified: Secondary | ICD-10-CM | POA: Diagnosis not present

## 2018-01-30 DIAGNOSIS — Z7984 Long term (current) use of oral hypoglycemic drugs: Secondary | ICD-10-CM | POA: Diagnosis not present

## 2018-01-30 DIAGNOSIS — M5106 Intervertebral disc disorders with myelopathy, lumbar region: Secondary | ICD-10-CM | POA: Diagnosis not present

## 2018-01-30 DIAGNOSIS — I10 Essential (primary) hypertension: Secondary | ICD-10-CM | POA: Diagnosis not present

## 2018-01-30 DIAGNOSIS — E119 Type 2 diabetes mellitus without complications: Secondary | ICD-10-CM | POA: Diagnosis not present

## 2018-01-30 DIAGNOSIS — E1165 Type 2 diabetes mellitus with hyperglycemia: Secondary | ICD-10-CM | POA: Diagnosis not present

## 2018-08-19 DIAGNOSIS — Z125 Encounter for screening for malignant neoplasm of prostate: Secondary | ICD-10-CM | POA: Diagnosis not present

## 2018-08-19 DIAGNOSIS — E785 Hyperlipidemia, unspecified: Secondary | ICD-10-CM | POA: Diagnosis not present

## 2018-08-19 DIAGNOSIS — M5106 Intervertebral disc disorders with myelopathy, lumbar region: Secondary | ICD-10-CM | POA: Diagnosis not present

## 2018-08-19 DIAGNOSIS — N529 Male erectile dysfunction, unspecified: Secondary | ICD-10-CM | POA: Diagnosis not present

## 2018-08-19 DIAGNOSIS — E119 Type 2 diabetes mellitus without complications: Secondary | ICD-10-CM | POA: Diagnosis not present

## 2018-08-19 DIAGNOSIS — I10 Essential (primary) hypertension: Secondary | ICD-10-CM | POA: Diagnosis not present

## 2018-08-19 DIAGNOSIS — N400 Enlarged prostate without lower urinary tract symptoms: Secondary | ICD-10-CM | POA: Diagnosis not present

## 2018-09-07 DIAGNOSIS — I213 ST elevation (STEMI) myocardial infarction of unspecified site: Secondary | ICD-10-CM | POA: Diagnosis not present

## 2018-09-07 DIAGNOSIS — I469 Cardiac arrest, cause unspecified: Secondary | ICD-10-CM | POA: Diagnosis not present

## 2018-10-07 DIAGNOSIS — Z23 Encounter for immunization: Secondary | ICD-10-CM | POA: Diagnosis not present

## 2018-11-03 IMAGING — MR MR LUMBAR SPINE W/O CM
4 of 5 series · 24 of 48 positions shown · non-contrast
Comparison: None.

CLINICAL DATA: Lumbar radiculopathy. Left leg numbness and tingling
for 6 weeks. Remote lumbar surgery. The patient declined IV
contrast.

EXAM:
MRI LUMBAR SPINE WITHOUT CONTRAST
TECHNIQUE: Multiplanar, multisequence MR imaging of the lumbar spine was
performed. No intravenous contrast was administered.

[Series 2: T2 · sagittal · 4.0mm · 0.55mm/px · 6 of 17 slices shown (1 of 2)]
[im 1/17]
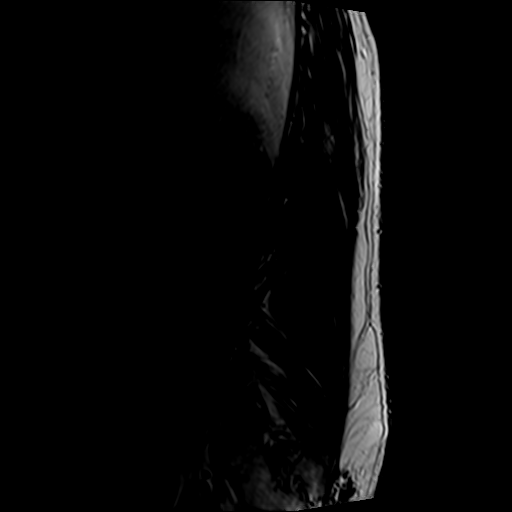
[im 4/17]
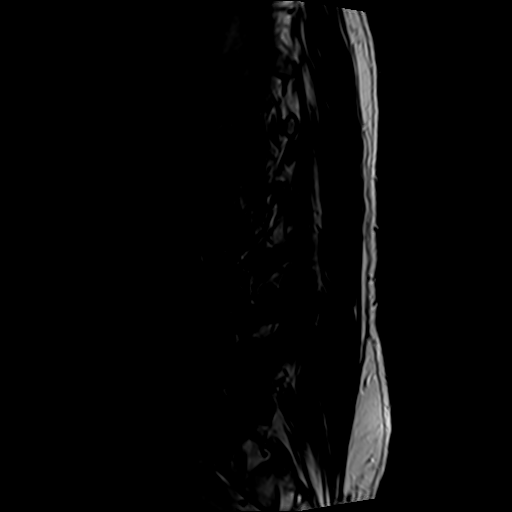
[im 7/17]
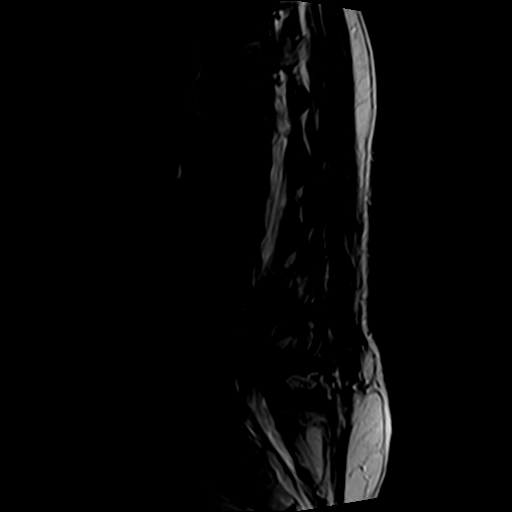
[im 10/17]
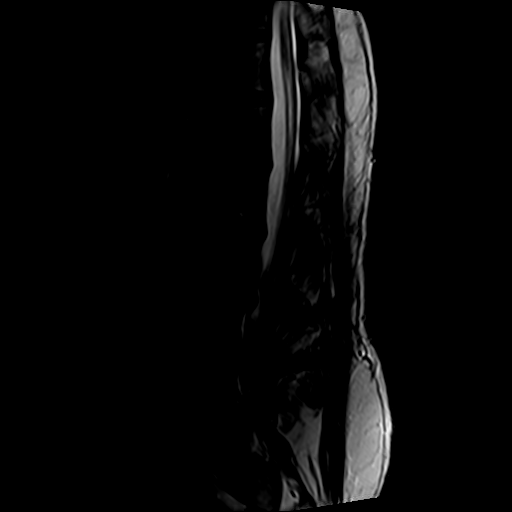
[im 13/17]
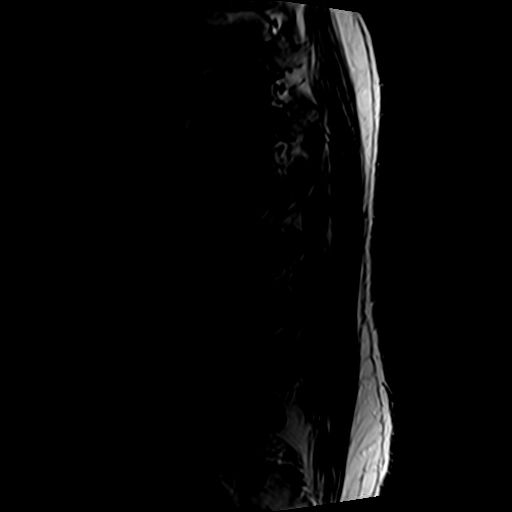
[im 17/17]
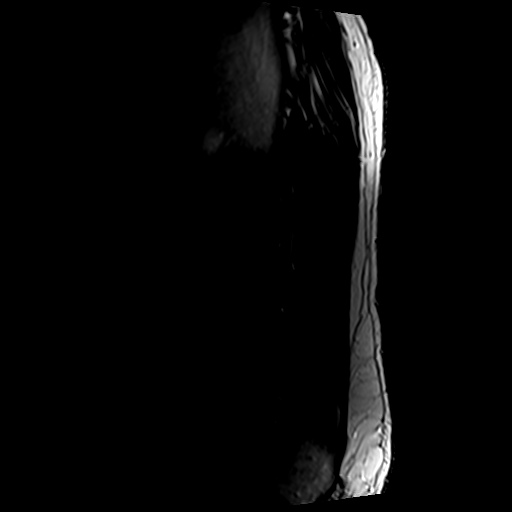

[Series 4: T1 · sagittal · 4.0mm · 0.55mm/px · 6 of 17 slices shown (1 of 2)]
[im 1/17]
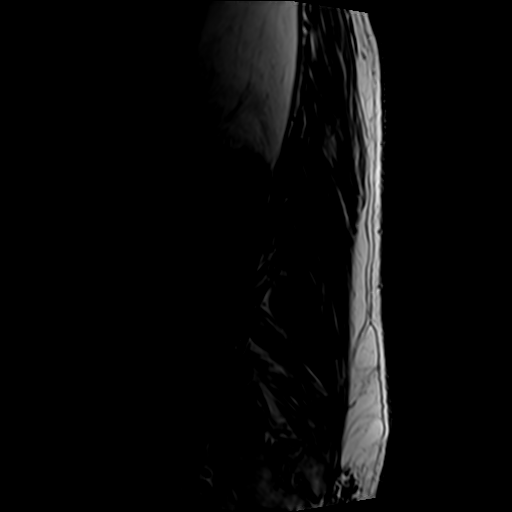
[im 4/17]
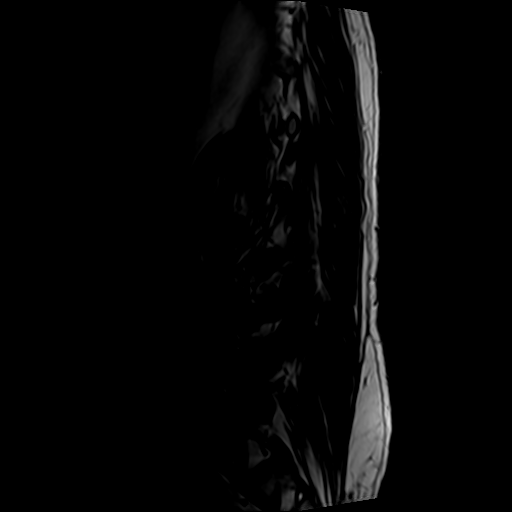
[im 7/17]
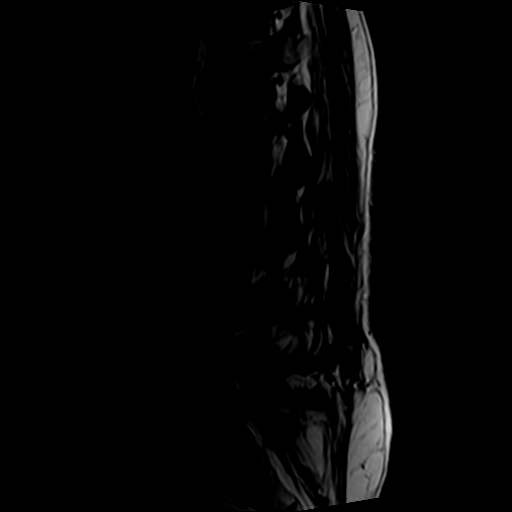
[im 10/17]
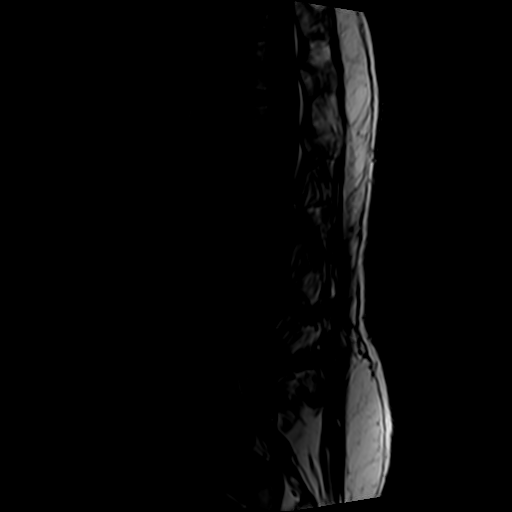
[im 13/17]
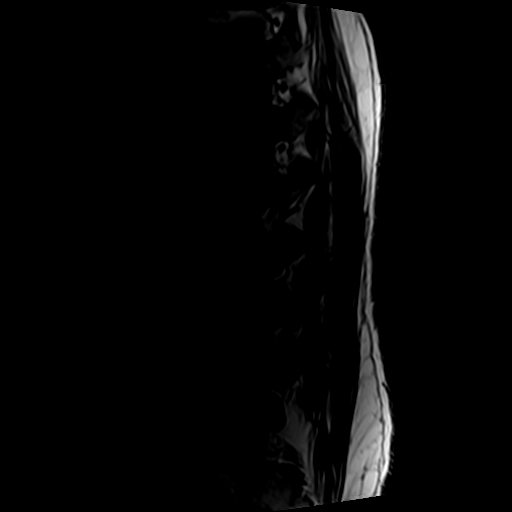
[im 17/17]
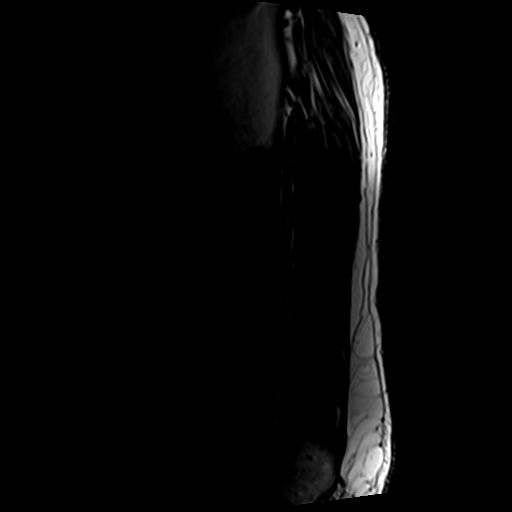

[Series 5: T2 · axial · 4.0mm · 0.78mm/px · z∈[-79,+137]mm · 9 of 42 slices shown (2 of 2)]
[im 1/42]
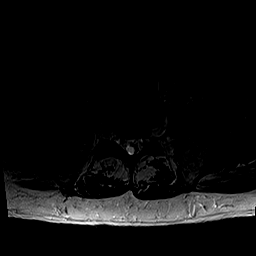
[im 6/42]
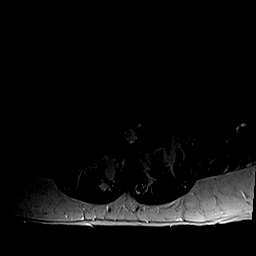
[im 12/42]
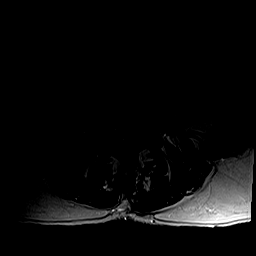
[im 18/42]
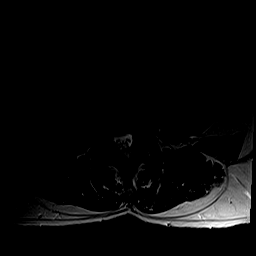
[im 21/42]
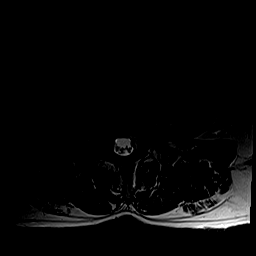
[im 24/42]
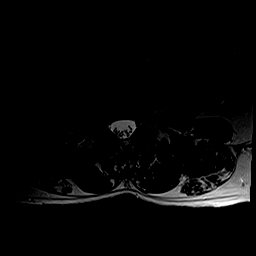
[im 30/42]
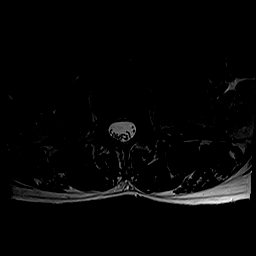
[im 36/42]
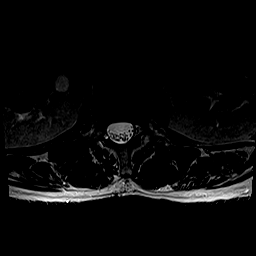
[im 42/42]
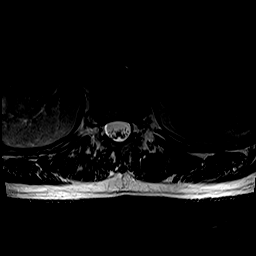

[Series 6: T1 · axial · 4.0mm · 0.39mm/px · z∈[-55,+109]mm · 3 of 42 slices shown (2 of 2)]
[im 6/42]
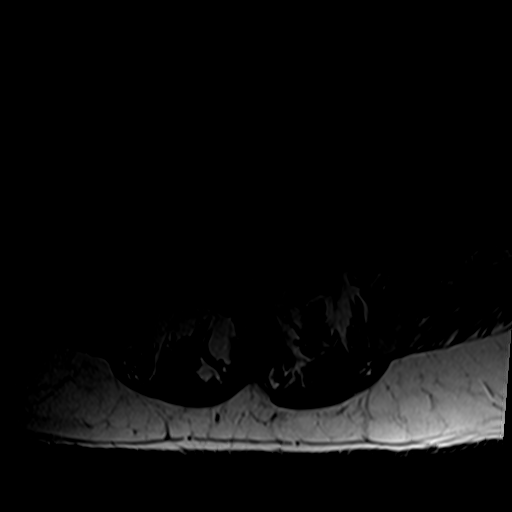
[im 21/42]
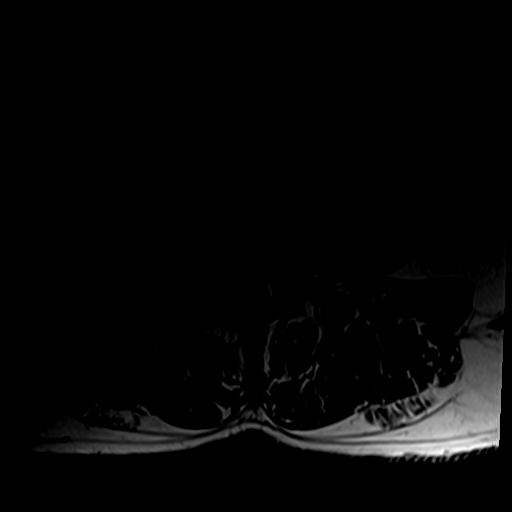
[im 36/42]
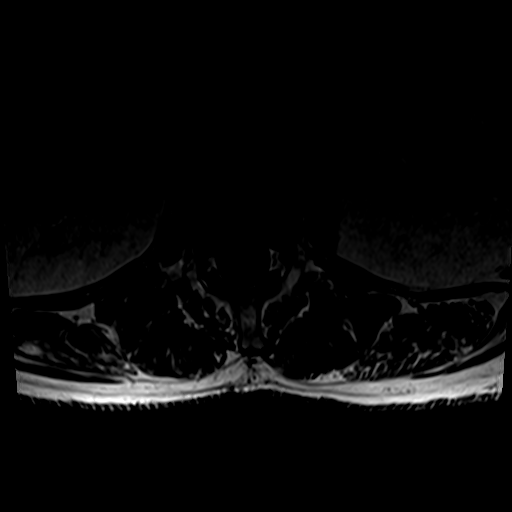

[24 of 48 positions shown; findings below may reference images not displayed]

FINDINGS: Segmentation: Normal lumbar segmentation is assumed, with the lowest
fully formed disc space designated L5-S1.

Alignment: Slight retrolisthesis of L3 on L4, L4 on L5, and L5 on
S1.

Vertebrae: No fracture, suspicious osseous lesion, or significant
marrow edema. Multilevel type 2 degenerative endplate changes.
Multiple Schmorl's nodes throughout the lumbar spine.

Conus medullaris: Extends to the T12-L1 level and appears normal.

Paraspinal and other soft tissues: Small T2 hyperintense lesions in
the right kidney measuring up to 1.6 cm in size, likely cysts but
incompletely evaluated.

Disc levels:

Disc desiccation throughout the lumbar spine. Moderate disc space
narrowing from L2-3 to L5-S1.

T12-L1:  Negative.

L1-2:  Negative.

L2-3:  Minimal disc bulging without stenosis.

L3-4: Circumferential disc bulging and mild facet hypertrophy result
in mild bilateral lateral recess stenosis without significant spinal
or neural foraminal stenosis.

L4-5: Circumferential disc bulging, endplate spurring, congenitally
short pedicles, moderate facet and ligamentum flavum hypertrophy,
and mildly prominent epidural fat result in moderate spinal
stenosis, moderate bilateral lateral recess stenosis, and mild left
greater than right neural foraminal stenosis. Potential L5 nerve
root impingement in the lateral recesses, more likely on the left.

L5-S1: Prior left laminectomy. Circumferential disc bulging,
endplate spurring, mild-to-moderate facet hypertrophy, and a 6 mm
synovial cyst medial to the left facet joint result in moderate to
severe left greater than right lateral recess stenosis and moderate
right greater than left neural foraminal stenosis. Potential S1
nerve root impingement in the lateral recesses, particularly on the
left given the synovial cyst. No significant spinal stenosis.
IMPRESSION: 1. Advanced multilevel lumbar disc degeneration.
2. Moderate to severe lateral recess stenosis at L5-S1 with a
left-sided synovial cyst contributing. Moderate bilateral foraminal
stenosis.
3. Moderate multifactorial spinal stenosis at L4-5.

## 2019-02-09 DIAGNOSIS — T148XXA Other injury of unspecified body region, initial encounter: Secondary | ICD-10-CM | POA: Diagnosis not present

## 2019-02-10 ENCOUNTER — Other Ambulatory Visit (HOSPITAL_COMMUNITY): Payer: Self-pay | Admitting: Family Medicine

## 2019-02-10 ENCOUNTER — Other Ambulatory Visit: Payer: Self-pay | Admitting: Family Medicine

## 2019-02-10 DIAGNOSIS — T148XXA Other injury of unspecified body region, initial encounter: Secondary | ICD-10-CM

## 2019-02-11 ENCOUNTER — Ambulatory Visit (HOSPITAL_COMMUNITY)
Admission: RE | Admit: 2019-02-11 | Discharge: 2019-02-11 | Disposition: A | Discharge status: Home / Self Care | Payer: Medicare Other | Source: Ambulatory Visit | Attending: Family Medicine | Admitting: Family Medicine

## 2019-02-11 DIAGNOSIS — T148XXA Other injury of unspecified body region, initial encounter: Secondary | ICD-10-CM | POA: Diagnosis not present

## 2019-02-11 DIAGNOSIS — R600 Localized edema: Secondary | ICD-10-CM | POA: Diagnosis not present

## 2019-02-23 DIAGNOSIS — E119 Type 2 diabetes mellitus without complications: Secondary | ICD-10-CM | POA: Diagnosis not present

## 2019-02-23 DIAGNOSIS — M5106 Intervertebral disc disorders with myelopathy, lumbar region: Secondary | ICD-10-CM | POA: Diagnosis not present

## 2019-02-23 DIAGNOSIS — E785 Hyperlipidemia, unspecified: Secondary | ICD-10-CM | POA: Diagnosis not present

## 2019-02-23 DIAGNOSIS — N400 Enlarged prostate without lower urinary tract symptoms: Secondary | ICD-10-CM | POA: Diagnosis not present

## 2019-02-23 DIAGNOSIS — I10 Essential (primary) hypertension: Secondary | ICD-10-CM | POA: Diagnosis not present

## 2019-02-23 DIAGNOSIS — N529 Male erectile dysfunction, unspecified: Secondary | ICD-10-CM | POA: Diagnosis not present

## 2019-02-23 DIAGNOSIS — M79604 Pain in right leg: Secondary | ICD-10-CM | POA: Diagnosis not present

## 2019-06-04 DIAGNOSIS — N529 Male erectile dysfunction, unspecified: Secondary | ICD-10-CM | POA: Diagnosis not present

## 2019-06-04 DIAGNOSIS — M791 Myalgia, unspecified site: Secondary | ICD-10-CM | POA: Diagnosis not present

## 2019-06-04 DIAGNOSIS — E785 Hyperlipidemia, unspecified: Secondary | ICD-10-CM | POA: Diagnosis not present

## 2019-06-04 DIAGNOSIS — I10 Essential (primary) hypertension: Secondary | ICD-10-CM | POA: Diagnosis not present

## 2019-06-04 DIAGNOSIS — N400 Enlarged prostate without lower urinary tract symptoms: Secondary | ICD-10-CM | POA: Diagnosis not present

## 2019-06-04 DIAGNOSIS — E119 Type 2 diabetes mellitus without complications: Secondary | ICD-10-CM | POA: Diagnosis not present

## 2019-07-30 DIAGNOSIS — I10 Essential (primary) hypertension: Secondary | ICD-10-CM | POA: Diagnosis not present

## 2019-07-30 DIAGNOSIS — E785 Hyperlipidemia, unspecified: Secondary | ICD-10-CM | POA: Diagnosis not present

## 2019-07-30 DIAGNOSIS — E119 Type 2 diabetes mellitus without complications: Secondary | ICD-10-CM | POA: Diagnosis not present

## 2019-07-30 DIAGNOSIS — N400 Enlarged prostate without lower urinary tract symptoms: Secondary | ICD-10-CM | POA: Diagnosis not present

## 2019-08-14 DIAGNOSIS — E785 Hyperlipidemia, unspecified: Secondary | ICD-10-CM | POA: Diagnosis not present

## 2019-08-14 DIAGNOSIS — E119 Type 2 diabetes mellitus without complications: Secondary | ICD-10-CM | POA: Diagnosis not present

## 2019-08-14 DIAGNOSIS — I10 Essential (primary) hypertension: Secondary | ICD-10-CM | POA: Diagnosis not present

## 2019-08-14 DIAGNOSIS — N400 Enlarged prostate without lower urinary tract symptoms: Secondary | ICD-10-CM | POA: Diagnosis not present

## 2019-10-26 ENCOUNTER — Other Ambulatory Visit: Payer: Self-pay

## 2019-10-26 ENCOUNTER — Emergency Department (HOSPITAL_COMMUNITY)
Admission: EM | Admit: 2019-10-26 | Discharge: 2019-10-26 | Disposition: A | Discharge status: Home / Self Care | Payer: Medicare Other | Attending: Emergency Medicine | Admitting: Emergency Medicine

## 2019-10-26 ENCOUNTER — Encounter (HOSPITAL_COMMUNITY): Payer: Self-pay

## 2019-10-26 ENCOUNTER — Emergency Department (HOSPITAL_COMMUNITY): Payer: Medicare Other

## 2019-10-26 DIAGNOSIS — I10 Essential (primary) hypertension: Secondary | ICD-10-CM | POA: Diagnosis not present

## 2019-10-26 DIAGNOSIS — R079 Chest pain, unspecified: Secondary | ICD-10-CM | POA: Diagnosis not present

## 2019-10-26 DIAGNOSIS — Z7982 Long term (current) use of aspirin: Secondary | ICD-10-CM | POA: Diagnosis not present

## 2019-10-26 DIAGNOSIS — Z79899 Other long term (current) drug therapy: Secondary | ICD-10-CM | POA: Diagnosis not present

## 2019-10-26 DIAGNOSIS — R200 Anesthesia of skin: Secondary | ICD-10-CM | POA: Diagnosis not present

## 2019-10-26 DIAGNOSIS — Z7984 Long term (current) use of oral hypoglycemic drugs: Secondary | ICD-10-CM | POA: Diagnosis not present

## 2019-10-26 DIAGNOSIS — Z87891 Personal history of nicotine dependence: Secondary | ICD-10-CM | POA: Insufficient documentation

## 2019-10-26 DIAGNOSIS — M5030 Other cervical disc degeneration, unspecified cervical region: Secondary | ICD-10-CM | POA: Diagnosis not present

## 2019-10-26 DIAGNOSIS — R52 Pain, unspecified: Secondary | ICD-10-CM

## 2019-10-26 DIAGNOSIS — E1165 Type 2 diabetes mellitus with hyperglycemia: Secondary | ICD-10-CM | POA: Diagnosis not present

## 2019-10-26 DIAGNOSIS — R0789 Other chest pain: Secondary | ICD-10-CM | POA: Diagnosis not present

## 2019-10-26 DIAGNOSIS — M50322 Other cervical disc degeneration at C5-C6 level: Secondary | ICD-10-CM | POA: Diagnosis not present

## 2019-10-26 DIAGNOSIS — R739 Hyperglycemia, unspecified: Secondary | ICD-10-CM

## 2019-10-26 LAB — BASIC METABOLIC PANEL
Anion gap: 8 (ref 5–15)
BUN: 19 mg/dL (ref 8–23)
CO2: 23 mmol/L (ref 22–32)
Calcium: 9.5 mg/dL (ref 8.9–10.3)
Chloride: 107 mmol/L (ref 98–111)
Creatinine, Ser: 0.69 mg/dL (ref 0.61–1.24)
GFR calc Af Amer: >60 mL/min (ref >60)
GFR calc non Af Amer: >60 mL/min (ref >60)
Glucose, Bld: 153 mg/dL — ABNORMAL HIGH (ref 70–99)
Potassium: 4 mmol/L (ref 3.5–5.1)
Sodium: 138 mmol/L (ref 135–145)

## 2019-10-26 LAB — CBC
HCT: 45.7 % (ref 39.0–52.0)
Hemoglobin: 15.8 g/dL (ref 13.0–17.0)
MCH: 30.6 pg (ref 26.0–34.0)
MCHC: 34.6 g/dL (ref 30.0–36.0)
MCV: 88.6 fL (ref 80.0–100.0)
Platelets: 154 10*3/uL (ref 150–400)
RBC: 5.16 MIL/uL (ref 4.22–5.81)
RDW: 12.6 % (ref 11.5–15.5)
WBC: 5.7 10*3/uL (ref 4.0–10.5)
nRBC: 0 % (ref 0.0–0.2)

## 2019-10-26 LAB — TROPONIN I (HIGH SENSITIVITY)
Troponin I (High Sensitivity): 4 ng/L (ref <18)
Troponin I (High Sensitivity): 5 ng/L (ref <18)

## 2019-10-26 NOTE — ED Provider Notes (Addendum)
[facility] EMERGENCY DEPARTMENT Provider Note   CSN: [id number] Arrival date & time: 10/26/19  1143     History   Chief Complaint Chief Complaint  Patient presents with   Chest Pain    HPI Michael Tapia is a 68 y.o. male.     Right-sided chest pain for 1 week described as "sore" without dyspnea, diaphoresis, nausea.  Symptoms never quite go away but wax and wane.  Not associated with any activity.  Review of systems positive for arms and hands "going to sleep", right greater than left, especially at night.  No cardiac history.  Past medical history includes hypertension, hyperlipidemia, diabetes.  Severity is moderate.     Past Medical History:  Diagnosis Date   Arthritis    knees   BPH (benign prostatic hyperplasia)    Diabetes mellitus without complication (HCC)    Type II   GERD (gastroesophageal reflux disease)    History of hiatal hernia    Hyperlipidemia    Hypertension    Lumbar stenosis with neurogenic claudication    Metabolic syndrome     Patient Active Problem List   Diagnosis Date Noted   Spinal stenosis of lumbar region 10/28/2017   DM (diabetes mellitus) type 2, uncontrolled, with ketoacidosis ([place]) 05/03/2014   HLD (hyperlipidemia) 05/03/2014   BPH (benign prostatic hyperplasia)    Need for prophylactic vaccination and inoculation against influenza 10/16/2013   Other and unspecified hyperlipidemia 03/14/2013   Unspecified essential hypertension 03/14/2013   Type II or unspecified type diabetes mellitus with unspecified complication, uncontrolled 123456   Metabolic syndrome 123456   Osteoarthritis of both knees 03/14/2013   GERD (gastroesophageal reflux disease) 03/14/2013   ELBOW PAIN 07/13/2008    Past Surgical History:  Procedure Laterality Date   BACK SURGERY     EYE SURGERY     Bilateral Cataracts   LUMBAR LAMINECTOMY/DECOMPRESSION MICRODISCECTOMY Bilateral 10/28/2017   Procedure: Bilateral L3-4 L4-5  L5-S1 Laminotomy/Foraminotomy;  Surgeon: Kristeen Miss, MD;  Location: [place];  Service: Neurosurgery;  Laterality: Bilateral;  Bilateral L3-4 L4-5 L5-S1 Laminotomy/Foraminotomy   MANDIBLE FRACTURE SURGERY     MENISCUS REPAIR     Left   TONSILLECTOMY          Home Medications    Prior to Admission medications   Medication Sig Start Date End Date Taking? Authorizing Provider  aspirin 81 MG tablet Take 81 mg by mouth daily.   Yes [provider]  Cholecalciferol (VITAMIN D-3) 5000 units TABS Take 1 tablet by mouth daily.   Yes [provider]  Coenzyme Q10 10 MG capsule Take 10 mg by mouth daily.   Yes [provider]  finasteride (PROSCAR) 5 MG tablet Take 5 mg by mouth daily.   Yes [provider]  FLAXSEED, LINSEED, PO Take 1 capsule by mouth daily.   Yes [provider]  glucose blood (ONE TOUCH ULTRA TEST) test strip Use daily 02/05/14  Yes Vernie Shanks, MD  lisinopril (ZESTRIL) 5 MG tablet Take 5 mg by mouth daily.    Yes [provider]  Misc Natural Products (GINSENG-COMPLEX PO) Take 1 capsule by mouth daily.   Yes [provider]  Omega-3 Fatty Acids (FISH OIL) 1000 MG CAPS Take 1 capsule by mouth daily.   Yes [provider]  omeprazole (PRILOSEC) 20 MG capsule Take 1 capsule (20 mg total) by mouth daily. 02/05/14  Yes Vernie Shanks, MD  [facility] DELICA LANCETS [id number] MISC 1 each by Does not  apply route every morning. 02/05/14  Yes Vernie Shanks, MD  Pitavastatin Calcium (LIVALO) 4 MG TABS Take 1 tablet by mouth daily.   Yes [provider]  sitaGLIPtin-metformin (JANUMET) 50-1000 MG per tablet TAKE 1 TABLET once a day Patient taking differently: Take 1 tablet by mouth daily.  02/05/14  Yes Vernie Shanks, MD  tadalafil (CIALIS) 5 MG tablet Take 5 mg by mouth every morning.   Yes [provider]  vitamin E 400 UNIT capsule Take 400 Units by mouth daily.   Yes [provider]     Family History No family history on file.  Social History Social History   Tobacco Use   Smoking status: Former Smoker    Quit date: 03/15/1979    Years since quitting: 40.6   Smokeless tobacco: Never Used  Substance Use Topics   Alcohol use: No   Drug use: No     Allergies   Bee venom, Ivp dye [iodinated diagnostic agents], and Penicillins   Review of Systems Review of Systems  All other systems reviewed and are negative.    Physical Exam Updated Vital Signs BP 122/68    Pulse 67    Temp 97.6 F (36.4 C) (Oral)    Resp 18    Ht 5\' 11"  (1.803 m)    Wt 100.7 kg    SpO2 97%    BMI 30.96 kg/m   Physical Exam Vitals signs and nursing note reviewed.  Constitutional:      Appearance: He is well-developed.  HENT:     Head: Normocephalic and atraumatic.  Eyes:     Conjunctiva/sclera: Conjunctivae normal.  Neck:     Musculoskeletal: Neck supple.  Cardiovascular:     Rate and Rhythm: Normal rate and regular rhythm.  Pulmonary:     Effort: Pulmonary effort is normal.     Breath sounds: Normal breath sounds.  Abdominal:     General: Bowel sounds are normal.     Palpations: Abdomen is soft.  Musculoskeletal: Normal range of motion.  Skin:    General: Skin is warm and dry.  Neurological:     General: No focal deficit present.     Mental Status: He is alert and oriented to person, place, and time.  Psychiatric:        Behavior: Behavior normal.      ED Treatments / Results  Labs (all labs ordered are listed, but only abnormal results are displayed) Labs Reviewed  BASIC METABOLIC PANEL - Abnormal; Notable for the following components:      Result Value   Glucose, Bld 153 (*)    All other components within normal limits  CBC  TROPONIN I (HIGH SENSITIVITY)  TROPONIN I (HIGH SENSITIVITY)    EKG EKG Interpretation  Date/Time:  Monday October 26 2019 12:02:50 EDT Ventricular Rate:  68 PR Interval:    QRS Duration: 101 QT Interval:  389 QTC  Calculation: 414 R Axis:   -41 Text Interpretation: Sinus rhythm Incomplete RBBB and LAFB Abnormal R-wave progression, late transition Minimal ST elevation, lateral leads Baseline wander in lead(s) I II aVR aVF V4 Confirmed by Nat Christen [phone]) on 10/26/2019 12:06:04 PM   Radiology Dg Chest 2 View  Result Date: 10/26/2019 CLINICAL DATA:  One-week history of RIGHT-sided chest pain that worsens at night. EXAM: CHEST - 2 VIEW COMPARISON:  11/02/2014. FINDINGS: Cardiac silhouette normal in size, unchanged. Thoracic aorta mildly atherosclerotic, unchanged. Hilar and mediastinal contours otherwise unremarkable. Chronic scarring involving the lower  lobes. Lungs otherwise clear. Pulmonary vascularity normal. No pleural effusions. Degenerative changes and DISH involving the thoracic spine. IMPRESSION: Chronic scarring involving the lower lobes. No acute cardiopulmonary disease. Electronically Signed   By: Evangeline Dakin M.D.   On: 10/26/2019 13:16   Dg Cervical Spine Complete  Result Date: 10/26/2019 CLINICAL DATA:  BILATERAL UPPER extremity numbness and paresthesias. Patient reports an unspecified congenital condition involving the cervical spine. EXAM: CERVICAL SPINE - COMPLETE 4+ VIEW COMPARISON:  01/13/2015 and earlier. FINDINGS: Anatomic alignment. Straightening of the usual cervical lordosis. 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 DISH. 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 Moderate disc space narrowing at C3-4, C4-5 and C5-6. Oblique views demonstrate severe LEFT foraminal stenosis at C3-4. No static evidence of instability. IMPRESSION: 1. No acute osseous abnormality. 2. 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 3. Moderate degenerative disc disease at C3-4, C4-5 and C5-6. 4. 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 DISH. Electronically Signed   By: Evangeline Dakin M.D.   On: 10/26/2019 13:25     Procedures Procedures (including critical care time)  Medications Ordered in ED Medications - No data to display   Initial Impression / Assessment and Plan / ED Course  I have reviewed the triage vital signs and the nursing notes.  Pertinent labs & imaging results that were available during my care of the patient were reviewed by me and considered in my medical decision making (see chart for details).       History of physical not typically consistent with acute coronary syndrome.  However, secondary to his risk factor profile, will obtain cardiac work-up.  1445: Patient rechecked.  Hemodynamically stable.  Discussed all test results including abnormal cervical spine films.  Patient will follow up with his primary care doctor for further evaluation.  May need cervical MRI in the future.  Final Clinical Impressions(s) / ED Diagnoses   Final diagnoses:  Chest pain, unspecified type  Numbness in both hands  Degenerative disc disease, cervical  Hyperglycemia    ED Discharge Orders    None       Nat Christen, MD 10/26/19 1258    Nat Christen, MD 10/26/19 1459

## 2019-10-26 NOTE — Discharge Instructions (Addendum)
Test showed no evidence of a heart attack.  Your sugar glucose was slightly elevated.  I suspect the tingling and numbness in your hands is related to issues in your neck.  Recommend follow-up with your primary care doctor.  May need an MRI of your neck at some point to further evaluate.

## 2019-10-26 NOTE — ED Triage Notes (Signed)
Pt reports right sided chest pain x one week. Worse at night. Pt reports that arms and hands go sleep . Right hand is stinging at this time

## 2019-10-26 NOTE — ED Notes (Signed)
Patient with no complaints at this time. Respirations even and unlabored. Skin warm/dry. Discharge instructions reviewed with patient at this time. Patient given opportunity to voice concerns/ask questions. IV removed per policy and band-aid applied to site. Patient discharged at this time and left Emergency Department with steady gait.  

## 2019-11-02 DIAGNOSIS — M9901 Segmental and somatic dysfunction of cervical region: Secondary | ICD-10-CM | POA: Diagnosis not present

## 2019-11-02 DIAGNOSIS — M5412 Radiculopathy, cervical region: Secondary | ICD-10-CM | POA: Diagnosis not present

## 2019-11-03 DIAGNOSIS — I10 Essential (primary) hypertension: Secondary | ICD-10-CM | POA: Diagnosis not present

## 2019-11-03 DIAGNOSIS — Z125 Encounter for screening for malignant neoplasm of prostate: Secondary | ICD-10-CM | POA: Diagnosis not present

## 2019-11-03 DIAGNOSIS — E785 Hyperlipidemia, unspecified: Secondary | ICD-10-CM | POA: Diagnosis not present

## 2019-11-03 DIAGNOSIS — R0789 Other chest pain: Secondary | ICD-10-CM | POA: Diagnosis not present

## 2019-11-03 DIAGNOSIS — F439 Reaction to severe stress, unspecified: Secondary | ICD-10-CM | POA: Diagnosis not present

## 2019-11-03 DIAGNOSIS — N400 Enlarged prostate without lower urinary tract symptoms: Secondary | ICD-10-CM | POA: Diagnosis not present

## 2019-11-03 DIAGNOSIS — E119 Type 2 diabetes mellitus without complications: Secondary | ICD-10-CM | POA: Diagnosis not present

## 2019-11-09 DIAGNOSIS — M5412 Radiculopathy, cervical region: Secondary | ICD-10-CM | POA: Diagnosis not present

## 2019-11-09 DIAGNOSIS — I10 Essential (primary) hypertension: Secondary | ICD-10-CM | POA: Diagnosis not present

## 2019-11-09 DIAGNOSIS — E785 Hyperlipidemia, unspecified: Secondary | ICD-10-CM | POA: Diagnosis not present

## 2019-11-09 DIAGNOSIS — M9901 Segmental and somatic dysfunction of cervical region: Secondary | ICD-10-CM | POA: Diagnosis not present

## 2019-11-09 DIAGNOSIS — N400 Enlarged prostate without lower urinary tract symptoms: Secondary | ICD-10-CM | POA: Diagnosis not present

## 2019-11-09 DIAGNOSIS — E119 Type 2 diabetes mellitus without complications: Secondary | ICD-10-CM | POA: Diagnosis not present

## 2019-11-12 DIAGNOSIS — M5412 Radiculopathy, cervical region: Secondary | ICD-10-CM | POA: Diagnosis not present

## 2019-11-12 DIAGNOSIS — M9901 Segmental and somatic dysfunction of cervical region: Secondary | ICD-10-CM | POA: Diagnosis not present

## 2019-11-16 DIAGNOSIS — M9901 Segmental and somatic dysfunction of cervical region: Secondary | ICD-10-CM | POA: Diagnosis not present

## 2019-11-16 DIAGNOSIS — M5412 Radiculopathy, cervical region: Secondary | ICD-10-CM | POA: Diagnosis not present

## 2020-01-21 DIAGNOSIS — E785 Hyperlipidemia, unspecified: Secondary | ICD-10-CM | POA: Diagnosis not present

## 2020-01-21 DIAGNOSIS — E119 Type 2 diabetes mellitus without complications: Secondary | ICD-10-CM | POA: Diagnosis not present

## 2020-01-21 DIAGNOSIS — I10 Essential (primary) hypertension: Secondary | ICD-10-CM | POA: Diagnosis not present

## 2020-01-21 DIAGNOSIS — N400 Enlarged prostate without lower urinary tract symptoms: Secondary | ICD-10-CM | POA: Diagnosis not present

## 2020-02-29 DIAGNOSIS — Z03818 Encounter for observation for suspected exposure to other biological agents ruled out: Secondary | ICD-10-CM | POA: Diagnosis not present

## 2020-02-29 DIAGNOSIS — R05 Cough: Secondary | ICD-10-CM | POA: Diagnosis not present

## 2020-03-07 DIAGNOSIS — Z23 Encounter for immunization: Secondary | ICD-10-CM | POA: Diagnosis not present

## 2020-04-05 DIAGNOSIS — Z23 Encounter for immunization: Secondary | ICD-10-CM | POA: Diagnosis not present

## 2020-05-03 DIAGNOSIS — E785 Hyperlipidemia, unspecified: Secondary | ICD-10-CM | POA: Diagnosis not present

## 2020-05-03 DIAGNOSIS — E119 Type 2 diabetes mellitus without complications: Secondary | ICD-10-CM | POA: Diagnosis not present

## 2020-05-03 DIAGNOSIS — E669 Obesity, unspecified: Secondary | ICD-10-CM | POA: Diagnosis not present

## 2020-05-03 DIAGNOSIS — M130 Polyarthritis, unspecified: Secondary | ICD-10-CM | POA: Diagnosis not present

## 2020-05-03 DIAGNOSIS — N400 Enlarged prostate without lower urinary tract symptoms: Secondary | ICD-10-CM | POA: Diagnosis not present

## 2020-05-03 DIAGNOSIS — I10 Essential (primary) hypertension: Secondary | ICD-10-CM | POA: Diagnosis not present

## 2020-05-30 DIAGNOSIS — N400 Enlarged prostate without lower urinary tract symptoms: Secondary | ICD-10-CM | POA: Diagnosis not present

## 2020-05-30 DIAGNOSIS — E785 Hyperlipidemia, unspecified: Secondary | ICD-10-CM | POA: Diagnosis not present

## 2020-05-30 DIAGNOSIS — I10 Essential (primary) hypertension: Secondary | ICD-10-CM | POA: Diagnosis not present

## 2020-05-30 DIAGNOSIS — E119 Type 2 diabetes mellitus without complications: Secondary | ICD-10-CM | POA: Diagnosis not present

## 2020-08-20 DIAGNOSIS — E785 Hyperlipidemia, unspecified: Secondary | ICD-10-CM | POA: Diagnosis not present

## 2020-08-20 DIAGNOSIS — I10 Essential (primary) hypertension: Secondary | ICD-10-CM | POA: Diagnosis not present

## 2020-08-20 DIAGNOSIS — E119 Type 2 diabetes mellitus without complications: Secondary | ICD-10-CM | POA: Diagnosis not present

## 2020-08-20 DIAGNOSIS — N400 Enlarged prostate without lower urinary tract symptoms: Secondary | ICD-10-CM | POA: Diagnosis not present

## 2020-09-06 DIAGNOSIS — E785 Hyperlipidemia, unspecified: Secondary | ICD-10-CM | POA: Diagnosis not present

## 2020-09-06 DIAGNOSIS — E119 Type 2 diabetes mellitus without complications: Secondary | ICD-10-CM | POA: Diagnosis not present

## 2020-09-06 DIAGNOSIS — Z7984 Long term (current) use of oral hypoglycemic drugs: Secondary | ICD-10-CM | POA: Diagnosis not present

## 2020-09-06 DIAGNOSIS — I10 Essential (primary) hypertension: Secondary | ICD-10-CM | POA: Diagnosis not present

## 2020-09-06 DIAGNOSIS — N400 Enlarged prostate without lower urinary tract symptoms: Secondary | ICD-10-CM | POA: Diagnosis not present

## 2020-10-17 DIAGNOSIS — I10 Essential (primary) hypertension: Secondary | ICD-10-CM | POA: Diagnosis not present

## 2020-10-17 DIAGNOSIS — E119 Type 2 diabetes mellitus without complications: Secondary | ICD-10-CM | POA: Diagnosis not present

## 2020-10-17 DIAGNOSIS — E785 Hyperlipidemia, unspecified: Secondary | ICD-10-CM | POA: Diagnosis not present

## 2020-10-17 DIAGNOSIS — N400 Enlarged prostate without lower urinary tract symptoms: Secondary | ICD-10-CM | POA: Diagnosis not present

## 2020-10-26 DIAGNOSIS — Z23 Encounter for immunization: Secondary | ICD-10-CM | POA: Diagnosis not present

## 2020-11-29 DIAGNOSIS — N400 Enlarged prostate without lower urinary tract symptoms: Secondary | ICD-10-CM | POA: Diagnosis not present

## 2020-11-29 DIAGNOSIS — E119 Type 2 diabetes mellitus without complications: Secondary | ICD-10-CM | POA: Diagnosis not present

## 2020-11-29 DIAGNOSIS — E785 Hyperlipidemia, unspecified: Secondary | ICD-10-CM | POA: Diagnosis not present

## 2020-11-29 DIAGNOSIS — I10 Essential (primary) hypertension: Secondary | ICD-10-CM | POA: Diagnosis not present

## 2020-12-07 DIAGNOSIS — Z0000 Encounter for general adult medical examination without abnormal findings: Secondary | ICD-10-CM | POA: Diagnosis not present

## 2020-12-08 ENCOUNTER — Encounter (INDEPENDENT_AMBULATORY_CARE_PROVIDER_SITE_OTHER): Payer: Self-pay | Admitting: *Deleted

## 2021-01-09 DIAGNOSIS — I10 Essential (primary) hypertension: Secondary | ICD-10-CM | POA: Diagnosis not present

## 2021-01-09 DIAGNOSIS — M25561 Pain in right knee: Secondary | ICD-10-CM | POA: Diagnosis not present

## 2021-01-09 DIAGNOSIS — N400 Enlarged prostate without lower urinary tract symptoms: Secondary | ICD-10-CM | POA: Diagnosis not present

## 2021-01-09 DIAGNOSIS — Z7984 Long term (current) use of oral hypoglycemic drugs: Secondary | ICD-10-CM | POA: Diagnosis not present

## 2021-01-09 DIAGNOSIS — Z23 Encounter for immunization: Secondary | ICD-10-CM | POA: Diagnosis not present

## 2021-01-09 DIAGNOSIS — E785 Hyperlipidemia, unspecified: Secondary | ICD-10-CM | POA: Diagnosis not present

## 2021-01-09 DIAGNOSIS — R079 Chest pain, unspecified: Secondary | ICD-10-CM | POA: Diagnosis not present

## 2021-01-09 DIAGNOSIS — E119 Type 2 diabetes mellitus without complications: Secondary | ICD-10-CM | POA: Diagnosis not present

## 2021-01-26 DIAGNOSIS — I10 Essential (primary) hypertension: Secondary | ICD-10-CM | POA: Diagnosis not present

## 2021-01-26 DIAGNOSIS — E119 Type 2 diabetes mellitus without complications: Secondary | ICD-10-CM | POA: Diagnosis not present

## 2021-01-26 DIAGNOSIS — N400 Enlarged prostate without lower urinary tract symptoms: Secondary | ICD-10-CM | POA: Diagnosis not present

## 2021-01-26 DIAGNOSIS — E785 Hyperlipidemia, unspecified: Secondary | ICD-10-CM | POA: Diagnosis not present

## 2021-01-26 DIAGNOSIS — G8929 Other chronic pain: Secondary | ICD-10-CM | POA: Diagnosis not present

## 2021-02-17 DIAGNOSIS — M1711 Unilateral primary osteoarthritis, right knee: Secondary | ICD-10-CM | POA: Diagnosis not present

## 2021-03-02 NOTE — H&P (Signed)
TOTAL KNEE ADMISSION H&P  Patient is being admitted for right total knee arthroplasty.  Subjective:  Chief Complaint:  Right knee OA / pain  HPI: Michael Tapia, 70 y.o. male, has a history of pain and functional disability in the right knee due to arthritis and has failed non-surgical conservative treatments for greater than 12 weeks to include NSAID's and/or analgesics and activity modification.  Onset of symptoms was gradual, starting years ago with gradually worsening course since that time. The patient noted prior procedures on the knee to include  arthroscopy on the right knee(s).  Patient currently rates pain in the right knee(s) at 8 out of 10 with activity. Patient has night pain, worsening of pain with activity and weight bearing, pain that interferes with activities of daily living, pain with passive range of motion, crepitus and joint swelling.  Patient has evidence of periarticular osteophytes and joint space narrowing by imaging studies. There is no active infection.  Risks, benefits and expectations were discussed with the patient.  Risks including but not limited to the risk of anesthesia, blood clots, nerve damage, blood vessel damage, failure of the prosthesis, infection and up to and including death.  Patient understand the risks, benefits and expectations and wishes to proceed with surgery.   D/C Plans:       Home  Post-op Meds:       No Rx given   Tranexamic Acid:      To be given - IV   Decadron:      Is to be given  FYI:      ASA  [employer]  DME:   Pt equipment arranged  PT:   OPPT arranged  Pharmacy: [facility]    Patient Active Problem List   Diagnosis Date Noted  . Spinal stenosis of lumbar region 10/28/2017  . DM (diabetes mellitus) type 2, uncontrolled, with ketoacidosis ([place]) 05/03/2014  . HLD (hyperlipidemia) 05/03/2014  . BPH (benign prostatic hyperplasia)   . Need for prophylactic vaccination and inoculation against influenza 10/16/2013  . Other and  unspecified hyperlipidemia 03/14/2013  . Unspecified essential hypertension 03/14/2013  . Type II or unspecified type diabetes mellitus with unspecified complication, uncontrolled 03/14/2013  . Metabolic syndrome 65/79/0383  . Osteoarthritis of both knees 03/14/2013  . GERD (gastroesophageal reflux disease) 03/14/2013  . ELBOW PAIN 07/13/2008   Past Medical History:  Diagnosis Date  . Arthritis    knees  . BPH (benign prostatic hyperplasia)   . Diabetes mellitus without complication ([place])    Type II  . GERD (gastroesophageal reflux disease)   . History of hiatal hernia   . Hyperlipidemia   . Hypertension   . Lumbar stenosis with neurogenic claudication   . Metabolic syndrome     Past Surgical History:  Procedure Laterality Date  . BACK SURGERY    . EYE SURGERY     Bilateral Cataracts  . LUMBAR LAMINECTOMY/DECOMPRESSION MICRODISCECTOMY Bilateral 10/28/2017   Procedure: Bilateral L3-4 L4-5 L5-S1 Laminotomy/Foraminotomy;  Surgeon: Kristeen Miss, MD;  Location: [place];  Service: Neurosurgery;  Laterality: Bilateral;  Bilateral L3-4 L4-5 L5-S1 Laminotomy/Foraminotomy  . MANDIBLE FRACTURE SURGERY    . MENISCUS REPAIR     Left  . TONSILLECTOMY      No current facility-administered medications for this encounter.   Current Outpatient Medications  Medication Sig Dispense Refill Last Dose  . aspirin 81 MG tablet Take 81 mg by mouth daily.     . Cholecalciferol (VITAMIN D-3) 5000 units TABS Take 5,000 Units  by mouth daily.     . Coenzyme Q10 100 MG capsule Take 100 mg by mouth daily.     . Dutasteride-Tamsulosin HCl 0.5-0.4 MG CAPS Take 1 capsule by mouth daily.     . Flaxseed, Linseed, (FLAXSEED OIL) 1400 MG CAPS Take 1,400 mg by mouth daily.     Marland Kitchen losartan (COZAAR) 25 MG tablet Take 25 mg by mouth daily.     . Misc Natural Products (GINSENG-COMPLEX PO) Take 1 capsule by mouth daily.     Marland Kitchen omeprazole (PRILOSEC) 20 MG capsule Take 1 capsule (20 mg total) by mouth daily. 90 capsule 3    . Pitavastatin Calcium 4 MG TABS Take 4 mg by mouth daily.     . sitaGLIPtin-metformin (JANUMET) 50-1000 MG per tablet TAKE 1 TABLET once a day (Patient taking differently: Take 1 tablet by mouth daily.) 90 tablet 3   . tadalafil (CIALIS) 5 MG tablet Take 5 mg by mouth every morning.     . Turmeric 500 MG CAPS Take 500 mg by mouth daily.     . vitamin E 400 UNIT capsule Take 400 Units by mouth daily.     . finasteride (PROSCAR) 5 MG tablet Take 5 mg by mouth daily. (Patient not taking: Reported on 02/24/2021)   Not Taking at Unknown time  . FLAXSEED, LINSEED, PO Take 1 capsule by mouth daily. (Patient not taking: Reported on 02/24/2021)   Not Taking at Unknown time  . glucose blood (ONE TOUCH ULTRA TEST) test strip Use daily 100 each 3   . lisinopril (ZESTRIL) 5 MG tablet Take 5 mg by mouth daily.  (Patient not taking: Reported on 02/24/2021)   Not Taking at Unknown time  . Omega-3 Fatty Acids (FISH OIL) 1000 MG CAPS Take 1 capsule by mouth daily. (Patient not taking: Reported on 02/24/2021)   Not Taking at Unknown time  . ONETOUCH DELICA LANCETS 21J MISC 1 each by Does not apply route every morning. 100 each 3    Allergies  Allergen Reactions  . Bee Venom Anaphylaxis  . Ivp Dye [Iodinated Diagnostic Agents] Anaphylaxis  . Penicillins Anaphylaxis    .Did it involve swelling of the face/tongue/throat, SOB, or low BP? Yes Did it involve sudden or severe rash/hives, skin peeling, or any reaction on the inside of your mouth or nose? Yes Did you need to seek medical attention at a hospital or doctor's office? Yes When did it last happen? If all above answers are "NO", may proceed with cephalosporin use.     Social History   Tobacco Use  . Smoking status: Former Smoker    Quit date: 03/15/1979    Years since quitting: 41.9  . Smokeless tobacco: Never Used  Substance Use Topics  . Alcohol use: No       Review of Systems  Constitutional: Negative.   HENT: Negative.   Eyes: Negative.    Respiratory: Negative.   Cardiovascular: Negative.   Gastrointestinal: Positive for heartburn.  Genitourinary: Negative.   Musculoskeletal: Positive for joint pain.  Skin: Negative.   Neurological: Negative.   Endo/Heme/Allergies: Negative.   Psychiatric/Behavioral: Negative.      Objective:  Physical Exam Constitutional:      Appearance: He is well-developed.  HENT:     Head: Normocephalic.  Eyes:     Pupils: Pupils are equal, round, and reactive to light.  Neck:     Thyroid: No thyromegaly.     Vascular: No JVD.     Trachea: No tracheal  deviation.  Cardiovascular:     Rate and Rhythm: Normal rate and regular rhythm.     Pulses: Intact distal pulses.  Pulmonary:     Effort: Pulmonary effort is normal. No respiratory distress.     Breath sounds: Normal breath sounds. No wheezing.  Abdominal:     Palpations: Abdomen is soft.     Tenderness: There is no abdominal tenderness. There is no guarding.  Musculoskeletal:     Cervical back: Neck supple.     Right knee: Swelling and bony tenderness present. No erythema or ecchymosis. Decreased range of motion. Tenderness present.  Lymphadenopathy:     Cervical: No cervical adenopathy.  Skin:    General: Skin is warm and dry.  Neurological:     Mental Status: He is alert and oriented to person, place, and time.     Sensory: Sensory deficit (DM neuropathy in the feet occasionally) present.  Psychiatric:        Mood and Affect: Mood and affect normal.      Labs:  Estimated body mass index is 30.96 kg/m as calculated from the following:   Height as of 10/26/19: 5\' 11"  (1.803 m).   Weight as of 10/26/19: 100.7 kg.   Imaging Review Plain radiographs demonstrate severe degenerative joint disease of the right knee(s).  The bone quality appears to be good for age and reported activity level.      Assessment/Plan:  End stage arthritis, right knee   The patient history, physical examination, clinical judgment of the  provider and imaging studies are consistent with end stage degenerative joint disease of the right knee(s) and total knee arthroplasty is deemed medically necessary. The treatment options including medical management, injection therapy arthroscopy and arthroplasty were discussed at length. The risks and benefits of total knee arthroplasty were presented and reviewed. The risks due to aseptic loosening, infection, stiffness, patella tracking problems, thromboembolic complications and other imponderables were discussed. The patient acknowledged the explanation, agreed to proceed with the plan and consent was signed. Patient is being admitted for treatment for surgery, pain control, PT, OT, prophylactic antibiotics, VTE prophylaxis, progressive ambulation and ADL's and discharge planning. The patient is planning to be discharged home.     Patient's anticipated LOS is less than 2 midnights, meeting these requirements: - Lives within 1 hour of care - Has a competent adult at home to recover with post-op recover - NO history of  - Chronic pain requiring opiods  - Coronary Artery Disease  - Heart failure  - Heart attack  - Stroke  - DVT/VTE  - Cardiac arrhythmia  - Respiratory Failure/COPD  - Renal failure  - Anemia  - Advanced Liver disease

## 2021-03-03 NOTE — Patient Instructions (Addendum)
DUE TO COVID-19 ONLY ONE VISITOR IS ALLOWED TO COME WITH YOU AND STAY IN THE WAITING ROOM ONLY DURING PRE OP AND PROCEDURE.   IF YOU WILL BE ADMITTED INTO THE HOSPITAL YOU ARE ALLOWED ONLY TWO SUPPORT PEOPLE DURING VISITATION HOURS ONLY (10AM -8PM)   . The support person(s) may change daily. . The support person(s) must pass our screening, gel in and out, and wear a mask at all times, including in the patient's room. . Patients must also wear a mask when staff or their support person are in the room.  No visitors under the age of 36. Any visitor under the age of 73 must be accompanied by an adult.    COVID SWAB TESTING MUST BE COMPLETED ON:  Friday, 03-10-21 @ 10:05 AM   [address] [place], [place] [address]  (Must self quarantine after testing. Follow instructions on handout.)    Your procedure is scheduled on:  Tuesday, 03-14-21   Report to [facility] Main  Entrance    Report to admitting at 11:50 AM   Call this number if you have problems the morning of surgery [phone]   Do not eat food :After Midnight.   May have liquids until 11:15 AM  day of surgery  CLEAR LIQUID DIET  Foods Allowed                                                                     Foods Excluded  Water, Black Coffee and tea, regular and decaf              liquids that you cannot  Plain Jell-O in any flavor  (No red)                                     see through such as: Fruit ices (not with fruit pulp)                                      milk, soups, orange juice              Iced Popsicles (No red)                                      All solid food                                   Apple juices Sports drinks like Gatorade (No red) Lightly seasoned clear broth or consume(fat free) Sugar, honey syrup       Oral Hygiene is also important to reduce your risk of infection.                                    Remember - BRUSH YOUR TEETH THE MORNING OF SURGERY WITH YOUR REGULAR  TOOTHPASTE   Do NOT smoke after Midnight  Take these medicines the morning of surgery with A SIP OF WATER:   Dutasteride, Omeprazole   How to Manage Your Diabetes Before and After Surgery  Why is it important to control my blood sugar before and after surgery? . Improving blood sugar levels before and after surgery helps healing and can limit problems. . A way of improving blood sugar control is eating a healthy diet by: o  Eating less sugar and carbohydrates o  Increasing activity/exercise o  Talking with your doctor about reaching your blood sugar goals . High blood sugars (greater than 180 mg/dL) can raise your risk of infections and slow your recovery, so you will need to focus on controlling your diabetes during the weeks before surgery. . Make sure that the doctor who takes care of your diabetes knows about your planned surgery including the date and location.  How do I manage my blood sugar before surgery? . Check your blood sugar at least 4 times a day, starting 2 days before surgery, to make sure that the level is not too high or low. o Check your blood sugar the morning of your surgery when you wake up and every 2 hours until you get to the Short Stay unit. . If your blood sugar is less than 70 mg/dL, you will need to treat for low blood sugar: o Do not take insulin. o Treat a low blood sugar (less than 70 mg/dL) with  cup of clear juice (cranberry or apple), 4 glucose tablets, OR glucose gel. o Recheck blood sugar in 15 minutes after treatment (to make sure it is greater than 70 mg/dL). If your blood sugar is not greater than 70 mg/dL on recheck, call [phone] for further instructions. . Report your blood sugar to the short stay nurse when you get to Short Stay.  . If you are admitted to the hospital after surgery: o Your blood sugar will be checked by the staff and you will probably be given insulin after surgery (instead of oral diabetes medicines) to make sure you  have good blood sugar levels. o The goal for blood sugar control after surgery is 80-180 mg/dL.   WHAT DO I DO ABOUT MY DIABETES MEDICATION?  Marland Kitchen Do not take oral diabetes medicines (pills) the morning of surgery.  . THE DAY BEFORE SURGERY:  Take Jamumet as prescribed.       . THE MORNING OF SURGERY:  Do not take [place].   Reviewed and Endorsed by [facility] Patient Education Committee, August 2015                               You may not have any metal on your body including hair pins, jewelry, and body piercings             Do not wear make-up, lotions, powders, perfumes/cologne, or deodorant             Do not wear nail polish.  Do not shave  48 hours prior to surgery.              Men may shave face and neck.   Do not bring valuables to the hospital. [place].   Contacts, dentures or bridgework may not be worn into surgery.   Bring small overnight bag day of surgery.  Please read over the following fact sheets you were given: IF YOU HAVE QUESTIONS ABOUT YOUR PRE OP INSTRUCTIONS PLEASE CALL  [facility] - Preparing for Surgery Before surgery, you can play an important role.  Because skin is not sterile, your skin needs to be as free of germs as possible.  You can reduce the number of germs on your skin by washing with CHG (chlorahexidine gluconate) soap before surgery.  CHG is an antiseptic cleaner which kills germs and bonds with the skin to continue killing germs even after washing. Please DO NOT use if you have an allergy to CHG or antibacterial soaps.  If your skin becomes reddened/irritated stop using the CHG and inform your nurse when you arrive at Short Stay. Do not shave (including legs and underarms) for at least 48 hours prior to the first CHG shower.  You may shave your face/neck.  Please follow these instructions carefully:  1.  Shower with CHG Soap the night before surgery and  the  morning of surgery.  2.  If you choose to wash your hair, wash your hair first as usual with your normal  shampoo.  3.  After you shampoo, rinse your hair and body thoroughly to remove the shampoo.                             4.  Use CHG as you would any other liquid soap.  You can apply chg directly to the skin and wash.  Gently with a scrungie or clean washcloth.  5.  Apply the CHG Soap to your body ONLY FROM THE NECK DOWN.   Do   not use on face/ open                           Wound or open sores. Avoid contact with eyes, ears mouth and   genitals (private parts).                       Wash face,  Genitals (private parts) with your normal soap.             6.  Wash thoroughly, paying special attention to the area where your    surgery  will be performed.  7.  Thoroughly rinse your body with warm water from the neck down.  8.  DO NOT shower/wash with your normal soap after using and rinsing off the CHG Soap.                9.  Pat yourself dry with a clean towel.            10.  Wear clean pajamas.            11.  Place clean sheets on your bed the night of your first shower and do not  sleep with pets. Day of Surgery : Do not apply any lotions/deodorants the morning of surgery.  Please wear clean clothes to the hospital/surgery center.  FAILURE TO FOLLOW THESE INSTRUCTIONS MAY RESULT IN THE CANCELLATION OF YOUR SURGERY  PATIENT SIGNATURE_________________________________  NURSE SIGNATURE__________________________________  ________________________________________________________________________   Michael Tapia  An incentive spirometer is a tool that can help keep your lungs clear and active. This tool measures how well you are filling your lungs with each breath. Taking long deep breaths may help reverse or decrease the chance of developing  breathing (pulmonary) problems (especially infection) following:  A long period of time when you are unable to move or be  active. BEFORE THE PROCEDURE   If the spirometer includes an indicator to show your best effort, your nurse or respiratory therapist will set it to a desired goal.  If possible, sit up straight or lean slightly forward. Try not to slouch.  Hold the incentive spirometer in an upright position. INSTRUCTIONS FOR USE  1. Sit on the edge of your bed if possible, or sit up as far as you can in bed or on a chair. 2. Hold the incentive spirometer in an upright position. 3. Breathe out normally. 4. Place the mouthpiece in your mouth and seal your lips tightly around it. 5. Breathe in slowly and as deeply as possible, raising the piston or the ball toward the top of the column. 6. Hold your breath for 3-5 seconds or for as long as possible. Allow the piston or ball to fall to the bottom of the column. 7. Remove the mouthpiece from your mouth and breathe out normally. 8. Rest for a few seconds and repeat Steps 1 through 7 at least 10 times every 1-2 hours when you are awake. Take your time and take a few normal breaths between deep breaths. 9. The spirometer may include an indicator to show your best effort. Use the indicator as a goal to work toward during each repetition. 10. After each set of 10 deep breaths, practice coughing to be sure your lungs are clear. If you have an incision (the cut made at the time of surgery), support your incision when coughing by placing a pillow or rolled up towels firmly against it. Once you are able to get out of bed, walk around indoors and cough well. You may stop using the incentive spirometer when instructed by your caregiver.  RISKS AND COMPLICATIONS  Take your time so you do not get dizzy or light-headed.  If you are in pain, you may need to take or ask for pain medication before doing incentive spirometry. It is harder to take a deep breath if you are having pain. AFTER USE  Rest and breathe slowly and easily.  It can be helpful to keep track of a log of  your progress. Your caregiver can provide you with a simple table to help with this. If you are using the spirometer at home, follow these instructions: [place] IF:   You are having difficultly using the spirometer.  You have trouble using the spirometer as often as instructed.  Your pain medication is not giving enough relief while using the spirometer.  You develop fever of 100.5 F (38.1 C) or higher. SEEK IMMEDIATE MEDICAL CARE IF:   You cough up bloody sputum that had not been present before.  You develop fever of 102 F (38.9 C) or greater.  You develop worsening pain at or near the incision site. MAKE SURE YOU:   Understand these instructions.  Will watch your condition.  Will get help right away if you are not doing well or get worse. Document Released: 04/29/2007 Document Revised: 03/10/2012 Document Reviewed: 06/30/2007 Exit[employer] Patient Information 2014 [employer], [place].   ________________________________________________________________________  WHAT IS A BLOOD TRANSFUSION? Blood Transfusion Information  A transfusion is the replacement of blood or some of its parts. Blood is made up of multiple cells which provide different functions.  Red blood cells carry oxygen and are used for blood loss replacement.  White blood cells fight  against infection.  Platelets control bleeding.  Plasma helps clot blood.  Other blood products are available for specialized needs, such as hemophilia or other clotting disorders. BEFORE THE TRANSFUSION  Who gives blood for transfusions?   Healthy volunteers who are fully evaluated to make sure their blood is safe. This is blood bank blood. Transfusion therapy is the safest it has ever been in the practice of medicine. Before blood is taken from a donor, a complete history is taken to make sure that person has no history of diseases nor engages in risky social behavior (examples are intravenous drug use or sexual activity  with multiple partners). The donor's travel history is screened to minimize risk of transmitting infections, such as malaria. The donated blood is tested for signs of infectious diseases, such as HIV and hepatitis. The blood is then tested to be sure it is compatible with you in order to minimize the chance of a transfusion reaction. If you or a relative donates blood, this is often done in anticipation of surgery and is not appropriate for emergency situations. It takes many days to process the donated blood. RISKS AND COMPLICATIONS Although transfusion therapy is very safe and saves many lives, the main dangers of transfusion include:   Getting an infectious disease.  Developing a transfusion reaction. This is an allergic reaction to something in the blood you were given. Every precaution is taken to prevent this. The decision to have a blood transfusion has been considered carefully by your caregiver before blood is given. Blood is not given unless the benefits outweigh the risks. AFTER THE TRANSFUSION  Right after receiving a blood transfusion, you will usually feel much better and more energetic. This is especially true if your red blood cells have gotten low (anemic). The transfusion raises the level of the red blood cells which carry oxygen, and this usually causes an energy increase.  The nurse administering the transfusion will monitor you carefully for complications. HOME CARE INSTRUCTIONS  No special instructions are needed after a transfusion. You may find your energy is better. Speak with your caregiver about any limitations on activity for underlying diseases you may have. SEEK MEDICAL CARE IF:   Your condition is not improving after your transfusion.  You develop redness or irritation at the intravenous (IV) site. SEEK IMMEDIATE MEDICAL CARE IF:  Any of the following symptoms occur over the next 12 hours:  Shaking chills.  You have a temperature by mouth above 102 F (38.9  C), not controlled by medicine.  Chest, back, or muscle pain.  People around you feel you are not acting correctly or are confused.  Shortness of breath or difficulty breathing.  Dizziness and fainting.  You get a rash or develop hives.  You have a decrease in urine output.  Your urine turns a dark color or changes to pink, red, or brown. Any of the following symptoms occur over the next 10 days:  You have a temperature by mouth above 102 F (38.9 C), not controlled by medicine.  Shortness of breath.  Weakness after normal activity.  The white part of the eye turns yellow (jaundice).  You have a decrease in the amount of urine or are urinating less often.  Your urine turns a dark color or changes to pink, red, or brown. Document Released: 12/14/2000 Document Revised: 03/10/2012 Document Reviewed: 08/02/2008 [facility] Patient Information 2014 [place], [place].  _______________________________________________________________________

## 2021-03-03 NOTE — Progress Notes (Addendum)
COVID Vaccine Completed:  x3 Date COVID Vaccine completed:  03-07-20, 04-05-20 Has received booster:  10-21 COVID vaccine manufacturer: [place]   Date of COVID positive in last 90 days: N/A  PCP - Yaakov Guthrie, MD Cardiologist - N/A  Chest x-ray - N/A EKG - 03-07-21 in Epic Stress Test -  ECHO -  Cardiac Cath -  Pacemaker/ICD device last checked: Spinal Cord Stimulator:  Sleep Study - N/A CPAP -   Fasting Blood Sugar - 120 to 140 Checks Blood Sugar - checks weekly  Blood Thinner Instructions: Aspirin Instructions:  ASA 81 mg  Last Dose:  02-05-21  Activity level:  Can go up a flight of stairs and perform activities of daily living without stopping and without symptoms of chest pain or shortness of breath.     Anesthesia review:  N/A  Patient denies shortness of breath, fever, cough and chest pain at PAT appointment   Patient verbalized understanding of instructions that were given to them at the PAT appointment. Patient was also instructed that they will need to review over the PAT instructions again at home before surgery.

## 2021-03-07 ENCOUNTER — Other Ambulatory Visit: Payer: Self-pay

## 2021-03-07 ENCOUNTER — Encounter (HOSPITAL_COMMUNITY)
Admission: RE | Admit: 2021-03-07 | Discharge: 2021-03-07 | Disposition: A | Discharge status: Home / Self Care | Payer: Medicare Other | Source: Ambulatory Visit | Attending: Orthopedic Surgery | Admitting: Orthopedic Surgery

## 2021-03-07 ENCOUNTER — Telehealth: Payer: Self-pay

## 2021-03-07 ENCOUNTER — Encounter (HOSPITAL_COMMUNITY): Payer: Self-pay

## 2021-03-07 DIAGNOSIS — I10 Essential (primary) hypertension: Secondary | ICD-10-CM | POA: Diagnosis not present

## 2021-03-07 DIAGNOSIS — Z01818 Encounter for other preprocedural examination: Secondary | ICD-10-CM | POA: Diagnosis not present

## 2021-03-07 DIAGNOSIS — G8929 Other chronic pain: Secondary | ICD-10-CM | POA: Diagnosis not present

## 2021-03-07 DIAGNOSIS — E119 Type 2 diabetes mellitus without complications: Secondary | ICD-10-CM | POA: Diagnosis not present

## 2021-03-07 DIAGNOSIS — N400 Enlarged prostate without lower urinary tract symptoms: Secondary | ICD-10-CM | POA: Diagnosis not present

## 2021-03-07 DIAGNOSIS — E785 Hyperlipidemia, unspecified: Secondary | ICD-10-CM | POA: Diagnosis not present

## 2021-03-07 LAB — HEMOGLOBIN A1C
Hgb A1c MFr Bld: 6.5 % — ABNORMAL HIGH (ref 4.8–5.6)
Mean Plasma Glucose: 139.85 mg/dL

## 2021-03-07 LAB — CBC
HCT: 43.9 % (ref 39.0–52.0)
Hemoglobin: 14.9 g/dL (ref 13.0–17.0)
MCH: 30.5 pg (ref 26.0–34.0)
MCHC: 33.9 g/dL (ref 30.0–36.0)
MCV: 90 fL (ref 80.0–100.0)
Platelets: 146 10*3/uL — ABNORMAL LOW (ref 150–400)
RBC: 4.88 MIL/uL (ref 4.22–5.81)
RDW: 13 % (ref 11.5–15.5)
WBC: 6.2 10*3/uL (ref 4.0–10.5)
nRBC: 0 % (ref 0.0–0.2)

## 2021-03-07 LAB — BASIC METABOLIC PANEL
Anion gap: 7 (ref 5–15)
BUN: 20 mg/dL (ref 8–23)
CO2: 22 mmol/L (ref 22–32)
Calcium: 9.4 mg/dL (ref 8.9–10.3)
Chloride: 108 mmol/L (ref 98–111)
Creatinine, Ser: 0.8 mg/dL (ref 0.61–1.24)
GFR, Estimated: >60 mL/min (ref >60)
Glucose, Bld: 147 mg/dL — ABNORMAL HIGH (ref 70–99)
Potassium: 4.2 mmol/L (ref 3.5–5.1)
Sodium: 137 mmol/L (ref 135–145)

## 2021-03-07 LAB — SURGICAL PCR SCREEN
MRSA, PCR: NEGATIVE
Staphylococcus aureus: POSITIVE — AB

## 2021-03-07 LAB — GLUCOSE, CAPILLARY: Glucose-Capillary: 142 mg/dL — ABNORMAL HIGH (ref 70–99)

## 2021-03-07 NOTE — Progress Notes (Signed)
PCR results sent to Dr. Olin to review.   

## 2021-03-07 NOTE — Telephone Encounter (Signed)
NOTES ON FILE FROM [facility] REFERRAL IN PROFICIENT

## 2021-03-09 ENCOUNTER — Other Ambulatory Visit: Payer: Self-pay

## 2021-03-09 ENCOUNTER — Encounter: Payer: Self-pay | Admitting: Cardiology

## 2021-03-09 ENCOUNTER — Ambulatory Visit: Payer: Medicare Other | Admitting: Cardiology

## 2021-03-09 VITALS — BP 155/84 | HR 66 | Temp 98.3°F | Resp 16 | Ht 71.0 in | Wt 232.4 lb

## 2021-03-09 DIAGNOSIS — Z01818 Encounter for other preprocedural examination: Secondary | ICD-10-CM | POA: Insufficient documentation

## 2021-03-09 DIAGNOSIS — R072 Precordial pain: Secondary | ICD-10-CM | POA: Insufficient documentation

## 2021-03-09 DIAGNOSIS — I10 Essential (primary) hypertension: Secondary | ICD-10-CM | POA: Diagnosis not present

## 2021-03-09 NOTE — Progress Notes (Signed)
Patient referred by Vernie Shanks, MD for pre-op risk stratification  Subjective:   Michael Tapia, male    DOB: 1951/02/10, 70 y.o.   MRN: [id number]   Chief Complaint  Patient presents with  . Surgical Clearance     HPI  70 y.o. Caucasian male with hypertension, type 2 DM, referred for pre-op evaluation.  Patient is going to undergo knee replacement surgery soon. In spite of his knee pain, he is able to exercise regularly-including 1 mile in 20 min on treadmill as well weight training. He denies chest pain, shortness of breath, palpitations, leg edema, orthopnea, PND, TIA/syncope. Blood pressure elevated today, but usually well controlled.    Past Medical History:  Diagnosis Date  . Arthritis    knees  . BPH (benign prostatic hyperplasia)   . Diabetes mellitus without complication ([place])    Type II  . GERD (gastroesophageal reflux disease)   . History of hiatal hernia   . Hyperlipidemia   . Hypertension   . Lumbar stenosis with neurogenic claudication   . Metabolic syndrome      Past Surgical History:  Procedure Laterality Date  . BACK SURGERY    . EYE SURGERY     Bilateral Cataracts  . LUMBAR LAMINECTOMY/DECOMPRESSION MICRODISCECTOMY Bilateral 10/28/2017   Procedure: Bilateral L3-4 L4-5 L5-S1 Laminotomy/Foraminotomy;  Surgeon: Kristeen Miss, MD;  Location: [facility];  Service: Neurosurgery;  Laterality: Bilateral;  Bilateral L3-4 L4-5 L5-S1 Laminotomy/Foraminotomy  . MANDIBLE FRACTURE SURGERY    . MENISCUS REPAIR     Left  . TONSILLECTOMY       Social History   Tobacco Use  Smoking Status Former Smoker  . Types: Cigarettes  . Quit date: 03/15/1979  . Years since quitting: 42.0  Smokeless Tobacco Never Used    Social History   Substance and Sexual Activity  Alcohol Use Yes   Comment: Rare     Family History  Problem Relation Age of Onset  . COPD Mother   . Stroke Father      Current Outpatient Medications on File Prior to Visit   Medication Sig Dispense Refill  . aspirin 81 MG tablet Take 81 mg by mouth daily.    . Cholecalciferol (VITAMIN D-3) 5000 units TABS Take 5,000 Units by mouth daily.    . Coenzyme Q10 100 MG capsule Take 100 mg by mouth daily.    . Dutasteride-Tamsulosin HCl 0.5-0.4 MG CAPS Take 1 capsule by mouth daily.    . Flaxseed, Linseed, (FLAXSEED OIL) 1400 MG CAPS Take 1,400 mg by mouth daily.    Marland Kitchen FLAXSEED, LINSEED, PO Take 1 capsule by mouth daily.    Marland Kitchen glucose blood (ONE TOUCH ULTRA TEST) test strip Use daily 100 each 3  . losartan (COZAAR) 25 MG tablet Take 25 mg by mouth daily.    . Misc Natural Products (GINSENG-COMPLEX PO) Take 1 capsule by mouth daily.    . Omega-3 Fatty Acids (FISH OIL) 1000 MG CAPS Take 1 capsule by mouth daily.    Marland Kitchen omeprazole (PRILOSEC) 20 MG capsule Take 1 capsule (20 mg total) by mouth daily. 90 capsule 3  . ONETOUCH DELICA LANCETS 02I MISC 1 each by Does not apply route every morning. 100 each 3  . Pitavastatin Calcium 4 MG TABS Take 4 mg by mouth daily.    . sitaGLIPtin-metformin (JANUMET) 50-1000 MG per tablet TAKE 1 TABLET once a day (Patient taking differently: Take 1 tablet by mouth daily.) 90 tablet 3  . tadalafil (CIALIS)  5 MG tablet Take 5 mg by mouth every morning.    . Turmeric 500 MG CAPS Take 500 mg by mouth daily.    . vitamin E 400 UNIT capsule Take 400 Units by mouth daily.     No current facility-administered medications on file prior to visit.    Cardiovascular and other pertinent studies:   EKG 03/07/2021: Sinus rhythm 69 bpm LAFB  Exercise nuclear stress test 2015: 7.5 METS Mild diaphragmatic attenuation in inferior and apical wall. Mild ischemia cannot be exckuded. Stress LVEF 51% Low risk study  Echocardiogram 2015: Mild LVH. EF 65%. Grade 1 DD. Mild LA dilatation.  Recent labs: 01/09/2021: Glucose 121, BUN/Cr 20/0.88. EGFR 85. Na/K 140/4.9. Rest of the CMP normal HbA1C 6.2% Chol 154, TG 89, HDL 46, LDL 92 TSH N/A    Review of  Systems  Cardiovascular: Negative for chest pain, dyspnea on exertion, leg swelling, palpitations and syncope.  Musculoskeletal: Positive for joint pain.         Vitals:   03/09/21 1309 03/09/21 1317  BP: (!) 170/78 (!) 155/84  Pulse: 73 66  Resp: 16   Temp: 98.3 F (36.8 C)   SpO2: 98% 94%     Body mass index is 32.41 kg/m. Filed Weights   03/09/21 1309  Weight: 232 lb 6.4 oz (105.4 kg)     Objective:   Physical Exam Vitals and nursing note reviewed.  Constitutional:      General: He is not in acute distress. Neck:     Vascular: No JVD.  Cardiovascular:     Rate and Rhythm: Normal rate and regular rhythm.     Heart sounds: Normal heart sounds. No murmur heard.   Pulmonary:     Effort: Pulmonary effort is normal.     Breath sounds: Normal breath sounds. No wheezing or rales.  Musculoskeletal:     Right lower leg: No edema.     Left lower leg: No edema.         Assessment & Recommendations:   70 y.o. Caucasian male with hypertension, type 2 DM, referred for pre-op evaluation.  Pre-op risk stratification: Mildly abnormal stress test in 2015. However, no angina/angina equivalent symptoms at this time.  God baseline functional capacity.  Okay to proceed with knee surgery with low peri-operative cardiac risk  Thank you for referring the patient to [place]. Please feel free to contact with any questions. I will see him on as needed basis.    Nigel Mormon, MD Pager: [phone] Office: [phone]

## 2021-03-10 ENCOUNTER — Other Ambulatory Visit (HOSPITAL_COMMUNITY)
Admission: RE | Admit: 2021-03-10 | Discharge: 2021-03-10 | Disposition: A | Discharge status: Home / Self Care | Payer: Medicare Other | Source: Ambulatory Visit | Attending: Orthopedic Surgery | Admitting: Orthopedic Surgery

## 2021-03-10 DIAGNOSIS — Z01812 Encounter for preprocedural laboratory examination: Secondary | ICD-10-CM | POA: Diagnosis not present

## 2021-03-10 DIAGNOSIS — Z20822 Contact with and (suspected) exposure to covid-19: Secondary | ICD-10-CM | POA: Insufficient documentation

## 2021-03-10 LAB — SARS CORONAVIRUS 2 (TAT 6-24 HRS): SARS Coronavirus 2: NEGATIVE

## 2021-03-13 MED ORDER — GENTAMICIN SULFATE 40 MG/ML IJ SOLN
5.0000 mg/kg | INTRAVENOUS | Status: AC
  Administered 2021-03-14: 440 mg via INTRAVENOUS
  Filled 2021-03-13: qty 11

## 2021-03-14 ENCOUNTER — Observation Stay (HOSPITAL_COMMUNITY)
Admission: RE | Admit: 2021-03-14 | Discharge: 2021-03-15 | Disposition: A | Discharge status: Home / Self Care | Payer: Medicare Other | Attending: Orthopedic Surgery | Admitting: Orthopedic Surgery

## 2021-03-14 ENCOUNTER — Encounter (HOSPITAL_COMMUNITY)
Admission: RE | Disposition: A | Discharge status: Home / Self Care | Payer: Self-pay | Source: Home / Self Care | Attending: Orthopedic Surgery

## 2021-03-14 ENCOUNTER — Encounter (HOSPITAL_COMMUNITY): Payer: Self-pay | Admitting: Orthopedic Surgery

## 2021-03-14 ENCOUNTER — Ambulatory Visit (HOSPITAL_COMMUNITY): Payer: Medicare Other | Admitting: Certified Registered Nurse Anesthetist

## 2021-03-14 ENCOUNTER — Other Ambulatory Visit: Payer: Self-pay

## 2021-03-14 DIAGNOSIS — M25461 Effusion, right knee: Secondary | ICD-10-CM | POA: Diagnosis not present

## 2021-03-14 DIAGNOSIS — I10 Essential (primary) hypertension: Secondary | ICD-10-CM | POA: Diagnosis not present

## 2021-03-14 DIAGNOSIS — Z79899 Other long term (current) drug therapy: Secondary | ICD-10-CM | POA: Insufficient documentation

## 2021-03-14 DIAGNOSIS — Z7984 Long term (current) use of oral hypoglycemic drugs: Secondary | ICD-10-CM | POA: Diagnosis not present

## 2021-03-14 DIAGNOSIS — Z7982 Long term (current) use of aspirin: Secondary | ICD-10-CM | POA: Insufficient documentation

## 2021-03-14 DIAGNOSIS — Z87891 Personal history of nicotine dependence: Secondary | ICD-10-CM | POA: Diagnosis not present

## 2021-03-14 DIAGNOSIS — G8918 Other acute postprocedural pain: Secondary | ICD-10-CM | POA: Diagnosis not present

## 2021-03-14 DIAGNOSIS — E669 Obesity, unspecified: Secondary | ICD-10-CM | POA: Diagnosis present

## 2021-03-14 DIAGNOSIS — Z880 Allergy status to penicillin: Secondary | ICD-10-CM | POA: Insufficient documentation

## 2021-03-14 DIAGNOSIS — Z96651 Presence of right artificial knee joint: Secondary | ICD-10-CM

## 2021-03-14 DIAGNOSIS — M1711 Unilateral primary osteoarthritis, right knee: Secondary | ICD-10-CM | POA: Diagnosis not present

## 2021-03-14 DIAGNOSIS — E1110 Type 2 diabetes mellitus with ketoacidosis without coma: Secondary | ICD-10-CM | POA: Insufficient documentation

## 2021-03-14 DIAGNOSIS — M25761 Osteophyte, right knee: Secondary | ICD-10-CM | POA: Diagnosis not present

## 2021-03-14 HISTORY — PX: TOTAL KNEE ARTHROPLASTY: SHX125

## 2021-03-14 LAB — GLUCOSE, CAPILLARY
Glucose-Capillary: 135 mg/dL — ABNORMAL HIGH (ref 70–99)
Glucose-Capillary: 174 mg/dL — ABNORMAL HIGH (ref 70–99)

## 2021-03-14 LAB — TYPE AND SCREEN
ABO/RH(D): O POS
Antibody Screen: NEGATIVE

## 2021-03-14 LAB — ABO/RH: ABO/RH(D): O POS

## 2021-03-14 SURGERY — ARTHROPLASTY, KNEE, TOTAL
Anesthesia: Spinal | Site: Knee | Laterality: Right

## 2021-03-14 MED ORDER — TRANEXAMIC ACID-NACL 1000-0.7 MG/100ML-% IV SOLN
1000.0000 mg | INTRAVENOUS | Status: AC
  Administered 2021-03-14: 1000 mg via INTRAVENOUS
  Filled 2021-03-14: qty 100

## 2021-03-14 MED ORDER — DIPHENHYDRAMINE HCL 12.5 MG/5ML PO ELIX: 12.5000 mg | ORAL_SOLUTION | ORAL | Status: DC | PRN

## 2021-03-14 MED ORDER — FENTANYL CITRATE (PF) 100 MCG/2ML IJ SOLN
50.0000 ug | Freq: Once | INTRAMUSCULAR | Status: AC
  Administered 2021-03-14: 50 ug via INTRAVENOUS
  Filled 2021-03-14: qty 2

## 2021-03-14 MED ORDER — CHLORHEXIDINE GLUCONATE 0.12 % MT SOLN
15.0000 mL | Freq: Once | OROMUCOSAL | Status: AC
  Administered 2021-03-14: 15 mL via OROMUCOSAL

## 2021-03-14 MED ORDER — METOCLOPRAMIDE HCL 5 MG PO TABS: 5.0000 mg | ORAL_TABLET | Freq: Three times a day (TID) | ORAL | Status: DC | PRN

## 2021-03-14 MED ORDER — PROPOFOL 500 MG/50ML IV EMUL
INTRAVENOUS | Status: DC | PRN
  Administered 2021-03-14: 85 ug/kg/min via INTRAVENOUS

## 2021-03-14 MED ORDER — METHOCARBAMOL 500 MG IVPB - SIMPLE MED
500.0000 mg | Freq: Four times a day (QID) | INTRAVENOUS | Status: DC | PRN
  Filled 2021-03-14: qty 50

## 2021-03-14 MED ORDER — LOSARTAN POTASSIUM 25 MG PO TABS
25.0000 mg | ORAL_TABLET | Freq: Every day | ORAL | Status: DC
  Administered 2021-03-14 – 2021-03-15 (×2): 25 mg via ORAL
  Filled 2021-03-14 (×2): qty 1

## 2021-03-14 MED ORDER — ASPIRIN 81 MG PO CHEW
81.0000 mg | CHEWABLE_TABLET | Freq: Two times a day (BID) | ORAL | Status: DC
  Administered 2021-03-14 – 2021-03-15 (×2): 81 mg via ORAL
  Filled 2021-03-14 (×2): qty 1

## 2021-03-14 MED ORDER — SODIUM CHLORIDE (PF) 0.9 % IJ SOLN
INTRAMUSCULAR | Status: AC
  Filled 2021-03-14: qty 30

## 2021-03-14 MED ORDER — FENTANYL CITRATE (PF) 100 MCG/2ML IJ SOLN: 25.0000 ug | INTRAMUSCULAR | Status: DC | PRN

## 2021-03-14 MED ORDER — DEXAMETHASONE SODIUM PHOSPHATE 10 MG/ML IJ SOLN
10.0000 mg | Freq: Once | INTRAMUSCULAR | Status: AC
  Administered 2021-03-15: 10 mg via INTRAVENOUS
  Filled 2021-03-14: qty 1

## 2021-03-14 MED ORDER — 0.9 % SODIUM CHLORIDE (POUR BTL) OPTIME
TOPICAL | Status: DC | PRN
  Administered 2021-03-14: 1000 mL

## 2021-03-14 MED ORDER — DUTASTERIDE 0.5 MG PO CAPS
0.5000 mg | ORAL_CAPSULE | Freq: Every day | ORAL | Status: DC
  Administered 2021-03-15: 0.5 mg via ORAL
  Filled 2021-03-14: qty 1

## 2021-03-14 MED ORDER — BUPIVACAINE IN DEXTROSE 0.75-8.25 % IT SOLN
INTRATHECAL | Status: DC | PRN
  Administered 2021-03-14: 1.6 mL via INTRATHECAL

## 2021-03-14 MED ORDER — TRANEXAMIC ACID-NACL 1000-0.7 MG/100ML-% IV SOLN
1000.0000 mg | Freq: Once | INTRAVENOUS | Status: AC
  Administered 2021-03-14: 1000 mg via INTRAVENOUS
  Filled 2021-03-14: qty 100

## 2021-03-14 MED ORDER — PRAVASTATIN SODIUM 20 MG PO TABS: 80.0000 mg | ORAL_TABLET | Freq: Every day | ORAL | Status: DC

## 2021-03-14 MED ORDER — VANCOMYCIN HCL IN DEXTROSE 1-5 GM/200ML-% IV SOLN
1000.0000 mg | INTRAVENOUS | Status: AC
  Administered 2021-03-14: 1000 mg via INTRAVENOUS
  Filled 2021-03-14: qty 200

## 2021-03-14 MED ORDER — SITAGLIPTIN PHOS-METFORMIN HCL 50-1000 MG PO TABS: 1.0000 | ORAL_TABLET | Freq: Every day | ORAL | Status: DC

## 2021-03-14 MED ORDER — HYDROCODONE-ACETAMINOPHEN 7.5-325 MG PO TABS: 1.0000 | ORAL_TABLET | ORAL | Status: DC | PRN

## 2021-03-14 MED ORDER — MENTHOL 3 MG MT LOZG: 1.0000 | LOZENGE | OROMUCOSAL | Status: DC | PRN

## 2021-03-14 MED ORDER — BUPIVACAINE-EPINEPHRINE (PF) 0.5% -1:200000 IJ SOLN
INTRAMUSCULAR | Status: DC | PRN
  Administered 2021-03-14: 15 mL via PERINEURAL

## 2021-03-14 MED ORDER — DOCUSATE SODIUM 100 MG PO CAPS
100.0000 mg | ORAL_CAPSULE | Freq: Two times a day (BID) | ORAL | Status: DC
  Administered 2021-03-14 – 2021-03-15 (×2): 100 mg via ORAL
  Filled 2021-03-14 (×2): qty 1

## 2021-03-14 MED ORDER — BISACODYL 10 MG RE SUPP: 10.0000 mg | Freq: Every day | RECTAL | Status: DC | PRN

## 2021-03-14 MED ORDER — MAGNESIUM CITRATE PO SOLN: 1.0000 | Freq: Once | ORAL | Status: DC | PRN

## 2021-03-14 MED ORDER — POLYETHYLENE GLYCOL 3350 17 G PO PACK
17.0000 g | PACK | Freq: Two times a day (BID) | ORAL | Status: DC
  Administered 2021-03-14 – 2021-03-15 (×2): 17 g via ORAL
  Filled 2021-03-14 (×2): qty 1

## 2021-03-14 MED ORDER — CELECOXIB 200 MG PO CAPS
200.0000 mg | ORAL_CAPSULE | Freq: Two times a day (BID) | ORAL | Status: DC
  Administered 2021-03-14: 200 mg via ORAL
  Filled 2021-03-14 (×2): qty 1

## 2021-03-14 MED ORDER — SODIUM CHLORIDE (PF) 0.9 % IJ SOLN
INTRAMUSCULAR | Status: DC | PRN
  Administered 2021-03-14: 30 mL

## 2021-03-14 MED ORDER — VANCOMYCIN HCL 1000 MG/200ML IV SOLN
1000.0000 mg | Freq: Two times a day (BID) | INTRAVENOUS | Status: AC
  Administered 2021-03-15: 1000 mg via INTRAVENOUS
  Filled 2021-03-14: qty 200

## 2021-03-14 MED ORDER — EPHEDRINE SULFATE-NACL 50-0.9 MG/10ML-% IV SOSY
PREFILLED_SYRINGE | INTRAVENOUS | Status: DC | PRN
  Administered 2021-03-14: 10 mg via INTRAVENOUS

## 2021-03-14 MED ORDER — KETOROLAC TROMETHAMINE 30 MG/ML IJ SOLN
INTRAMUSCULAR | Status: DC | PRN
  Administered 2021-03-14: 30 mg

## 2021-03-14 MED ORDER — KETAMINE HCL 10 MG/ML IJ SOLN
INTRAMUSCULAR | Status: DC | PRN
  Administered 2021-03-14 (×2): 20 mg via INTRAVENOUS

## 2021-03-14 MED ORDER — DUTASTERIDE-TAMSULOSIN HCL 0.5-0.4 MG PO CAPS: 1.0000 | ORAL_CAPSULE | Freq: Every day | ORAL | Status: DC

## 2021-03-14 MED ORDER — STERILE WATER FOR IRRIGATION IR SOLN
Status: DC | PRN
  Administered 2021-03-14: 2000 mL

## 2021-03-14 MED ORDER — INSULIN ASPART 100 UNIT/ML ~~LOC~~ SOLN: 0.0000 [IU] | Freq: Three times a day (TID) | SUBCUTANEOUS | Status: DC

## 2021-03-14 MED ORDER — HYDROMORPHONE HCL 1 MG/ML IJ SOLN: 0.5000 mg | INTRAMUSCULAR | Status: DC | PRN

## 2021-03-14 MED ORDER — CLONIDINE HCL (ANALGESIA) 100 MCG/ML EP SOLN
EPIDURAL | Status: DC | PRN
  Administered 2021-03-14: 100 ug

## 2021-03-14 MED ORDER — ONDANSETRON HCL 4 MG/2ML IJ SOLN
INTRAMUSCULAR | Status: AC
  Filled 2021-03-14: qty 2

## 2021-03-14 MED ORDER — ALUM & MAG HYDROXIDE-SIMETH 200-200-20 MG/5ML PO SUSP: 15.0000 mL | ORAL | Status: DC | PRN

## 2021-03-14 MED ORDER — PHENYLEPHRINE 40 MCG/ML (10ML) SYRINGE FOR IV PUSH (FOR BLOOD PRESSURE SUPPORT)
PREFILLED_SYRINGE | INTRAVENOUS | Status: DC | PRN
  Administered 2021-03-14 (×2): 200 ug via INTRAVENOUS

## 2021-03-14 MED ORDER — ONDANSETRON HCL 4 MG/2ML IJ SOLN: 4.0000 mg | Freq: Four times a day (QID) | INTRAMUSCULAR | Status: DC | PRN

## 2021-03-14 MED ORDER — LINAGLIPTIN 5 MG PO TABS
5.0000 mg | ORAL_TABLET | Freq: Every day | ORAL | Status: DC
  Filled 2021-03-14: qty 1

## 2021-03-14 MED ORDER — SODIUM CHLORIDE 0.9 % IV SOLN: INTRAVENOUS | Status: DC

## 2021-03-14 MED ORDER — TAMSULOSIN HCL 0.4 MG PO CAPS
0.4000 mg | ORAL_CAPSULE | Freq: Every day | ORAL | Status: DC
  Administered 2021-03-15: 0.4 mg via ORAL
  Filled 2021-03-14: qty 1

## 2021-03-14 MED ORDER — METHOCARBAMOL 500 MG PO TABS: 500.0000 mg | ORAL_TABLET | Freq: Four times a day (QID) | ORAL | Status: DC | PRN

## 2021-03-14 MED ORDER — DEXMEDETOMIDINE (PRECEDEX) IN NS 20 MCG/5ML (4 MCG/ML) IV SYRINGE
PREFILLED_SYRINGE | INTRAVENOUS | Status: AC
  Filled 2021-03-14: qty 5

## 2021-03-14 MED ORDER — DEXAMETHASONE SODIUM PHOSPHATE 10 MG/ML IJ SOLN
INTRAMUSCULAR | Status: AC
  Filled 2021-03-14: qty 1

## 2021-03-14 MED ORDER — HYDROCODONE-ACETAMINOPHEN 5-325 MG PO TABS: 1.0000 | ORAL_TABLET | ORAL | Status: DC | PRN

## 2021-03-14 MED ORDER — BUPIVACAINE-EPINEPHRINE (PF) 0.25% -1:200000 IJ SOLN
INTRAMUSCULAR | Status: AC
  Filled 2021-03-14: qty 30

## 2021-03-14 MED ORDER — KETAMINE HCL 10 MG/ML IJ SOLN
INTRAMUSCULAR | Status: AC
  Filled 2021-03-14: qty 1

## 2021-03-14 MED ORDER — LACTATED RINGERS IV SOLN: INTRAVENOUS | Status: DC

## 2021-03-14 MED ORDER — OXYCODONE HCL 5 MG/5ML PO SOLN: 5.0000 mg | Freq: Once | ORAL | Status: DC | PRN

## 2021-03-14 MED ORDER — ONDANSETRON HCL 4 MG PO TABS: 4.0000 mg | ORAL_TABLET | Freq: Four times a day (QID) | ORAL | Status: DC | PRN

## 2021-03-14 MED ORDER — METFORMIN HCL 500 MG PO TABS
1000.0000 mg | ORAL_TABLET | Freq: Every day | ORAL | Status: DC
  Filled 2021-03-14: qty 2

## 2021-03-14 MED ORDER — DEXAMETHASONE SODIUM PHOSPHATE 10 MG/ML IJ SOLN: 10.0000 mg | Freq: Once | INTRAMUSCULAR | Status: DC

## 2021-03-14 MED ORDER — PHENYLEPHRINE 40 MCG/ML (10ML) SYRINGE FOR IV PUSH (FOR BLOOD PRESSURE SUPPORT)
PREFILLED_SYRINGE | INTRAVENOUS | Status: AC
  Filled 2021-03-14: qty 10

## 2021-03-14 MED ORDER — INSULIN ASPART 100 UNIT/ML ~~LOC~~ SOLN: 0.0000 [IU] | Freq: Every day | SUBCUTANEOUS | Status: DC

## 2021-03-14 MED ORDER — FERROUS SULFATE 325 (65 FE) MG PO TABS
325.0000 mg | ORAL_TABLET | Freq: Two times a day (BID) | ORAL | Status: DC
  Administered 2021-03-15: 325 mg via ORAL
  Filled 2021-03-14: qty 1

## 2021-03-14 MED ORDER — KETOROLAC TROMETHAMINE 30 MG/ML IJ SOLN
INTRAMUSCULAR | Status: AC
  Filled 2021-03-14: qty 1

## 2021-03-14 MED ORDER — PROMETHAZINE HCL 25 MG/ML IJ SOLN: 6.2500 mg | INTRAMUSCULAR | Status: DC | PRN

## 2021-03-14 MED ORDER — ACETAMINOPHEN 325 MG PO TABS: 325.0000 mg | ORAL_TABLET | Freq: Four times a day (QID) | ORAL | Status: DC | PRN

## 2021-03-14 MED ORDER — PANTOPRAZOLE SODIUM 40 MG PO TBEC
40.0000 mg | DELAYED_RELEASE_TABLET | Freq: Every day | ORAL | Status: DC
  Filled 2021-03-14: qty 1

## 2021-03-14 MED ORDER — PROPOFOL 10 MG/ML IV BOLUS
INTRAVENOUS | Status: DC | PRN
  Administered 2021-03-14: 40 mg via INTRAVENOUS
  Administered 2021-03-14 (×2): 30 mg via INTRAVENOUS

## 2021-03-14 MED ORDER — METOCLOPRAMIDE HCL 5 MG/ML IJ SOLN: 5.0000 mg | Freq: Three times a day (TID) | INTRAMUSCULAR | Status: DC | PRN

## 2021-03-14 MED ORDER — POVIDONE-IODINE 10 % EX SWAB
2.0000 "application " | Freq: Once | CUTANEOUS | Status: AC
  Administered 2021-03-14: 2 via TOPICAL

## 2021-03-14 MED ORDER — PHENYLEPHRINE HCL-NACL 10-0.9 MG/250ML-% IV SOLN
INTRAVENOUS | Status: DC | PRN
  Administered 2021-03-14: 50 ug/min via INTRAVENOUS

## 2021-03-14 MED ORDER — SODIUM CHLORIDE 0.9 % IR SOLN
Status: DC | PRN
  Administered 2021-03-14: 1000 mL

## 2021-03-14 MED ORDER — OXYCODONE HCL 5 MG PO TABS: 5.0000 mg | ORAL_TABLET | Freq: Once | ORAL | Status: DC | PRN

## 2021-03-14 MED ORDER — ORAL CARE MOUTH RINSE: 15.0000 mL | Freq: Once | OROMUCOSAL | Status: AC

## 2021-03-14 MED ORDER — GLYCOPYRROLATE PF 0.2 MG/ML IJ SOSY
PREFILLED_SYRINGE | INTRAMUSCULAR | Status: DC | PRN
  Administered 2021-03-14 (×2): .1 mg via INTRAVENOUS

## 2021-03-14 MED ORDER — ACETAMINOPHEN 500 MG PO TABS: 1000.0000 mg | ORAL_TABLET | Freq: Once | ORAL | Status: DC

## 2021-03-14 MED ORDER — PHENOL 1.4 % MT LIQD: 1.0000 | OROMUCOSAL | Status: DC | PRN

## 2021-03-14 MED ORDER — MIDAZOLAM HCL 2 MG/2ML IJ SOLN: 1.0000 mg | INTRAMUSCULAR | Status: DC

## 2021-03-14 MED ORDER — BUPIVACAINE-EPINEPHRINE (PF) 0.25% -1:200000 IJ SOLN
INTRAMUSCULAR | Status: DC | PRN
  Administered 2021-03-14: 30 mL

## 2021-03-14 SURGICAL SUPPLY — 61 items
ADH SKN CLS APL DERMABOND .7 (GAUZE/BANDAGES/DRESSINGS) ×1
ATTUNE MED ANAT PAT 38 KNEE (Knees) ×2 IMPLANT
ATTUNE PS FEM RT SZ 7 CEM KNEE (Femur) ×2 IMPLANT
ATTUNE PSRP INSR SZ7 6 KNEE (Insert) ×2 IMPLANT
BAG SPEC THK2 15X12 ZIP CLS (MISCELLANEOUS)
BAG ZIPLOCK 12X15 (MISCELLANEOUS) IMPLANT
BASE TIBIAL ROT PLAT SZ 7 KNEE (Knees) ×1 IMPLANT
BLADE SAW SGTL 11.0X1.19X90.0M (BLADE) IMPLANT
BLADE SAW SGTL 13.0X1.19X90.0M (BLADE) ×2 IMPLANT
BLADE SURG SZ10 CARB STEEL (BLADE) ×4 IMPLANT
BNDG CMPR MED 10X6 ELC LF (GAUZE/BANDAGES/DRESSINGS) ×1
BNDG ELASTIC 6X10 VLCR STRL LF (GAUZE/BANDAGES/DRESSINGS) ×2 IMPLANT
BNDG ELASTIC 6X5.8 VLCR STR LF (GAUZE/BANDAGES/DRESSINGS) ×2 IMPLANT
BOWL SMART MIX CTS (DISPOSABLE) ×2 IMPLANT
BSPLAT TIB 7 CMNT ROT PLAT STR (Knees) ×1 IMPLANT
CEMENT HV SMART SET (Cement) ×4 IMPLANT
COVER WAND RF STERILE (DRAPES) IMPLANT
CUFF TOURN SGL QUICK 34 (TOURNIQUET CUFF) ×2
CUFF TRNQT CYL 34X4.125X (TOURNIQUET CUFF) ×1 IMPLANT
DECANTER SPIKE VIAL GLASS SM (MISCELLANEOUS) ×4 IMPLANT
DERMABOND ADVANCED (GAUZE/BANDAGES/DRESSINGS) ×1
DERMABOND ADVANCED .7 DNX12 (GAUZE/BANDAGES/DRESSINGS) ×1 IMPLANT
DRAPE U-SHAPE 47X51 STRL (DRAPES) ×2 IMPLANT
DRESSING AQUACEL AG SP 3.5X10 (GAUZE/BANDAGES/DRESSINGS) ×1 IMPLANT
DRSG AQUACEL AG ADV 3.5X10 (GAUZE/BANDAGES/DRESSINGS) ×2 IMPLANT
DRSG AQUACEL AG SP 3.5X10 (GAUZE/BANDAGES/DRESSINGS) ×2
DURAPREP 26ML APPLICATOR (WOUND CARE) ×4 IMPLANT
ELECT REM PT RETURN 15FT ADLT (MISCELLANEOUS) ×2 IMPLANT
GLOVE ORTHO TXT STRL SZ7.5 (GLOVE) ×2 IMPLANT
GLOVE SURG ENC MOIS LTX SZ6 (GLOVE) IMPLANT
GLOVE SURG LTX SZ8 (GLOVE) ×2 IMPLANT
GLOVE SURG UNDER POLY LF SZ6.5 (GLOVE) IMPLANT
GLOVE SURG UNDER POLY LF SZ7.5 (GLOVE) ×2 IMPLANT
GLOVE SURG UNDER POLY LF SZ8.5 (GLOVE) IMPLANT
GOWN STRL REUS W/TWL 2XL LVL3 (GOWN DISPOSABLE) IMPLANT
GOWN STRL REUS W/TWL LRG LVL3 (GOWN DISPOSABLE) ×2 IMPLANT
HANDPIECE INTERPULSE COAX TIP (DISPOSABLE) ×2
HOLDER FOLEY CATH W/STRAP (MISCELLANEOUS) IMPLANT
KIT TURNOVER KIT A (KITS) ×2 IMPLANT
MANIFOLD NEPTUNE II (INSTRUMENTS) ×2 IMPLANT
NDL SAFETY ECLIPSE 18X1.5 (NEEDLE) IMPLANT
NEEDLE HYPO 18GX1.5 SHARP (NEEDLE)
NS IRRIG 1000ML POUR BTL (IV SOLUTION) ×2 IMPLANT
PACK TOTAL KNEE CUSTOM (KITS) ×2 IMPLANT
PENCIL SMOKE EVACUATOR (MISCELLANEOUS) IMPLANT
PIN DRILL FIX HALF THREAD (BIT) ×2 IMPLANT
PIN FIX SIGMA LCS THRD HI (PIN) ×2 IMPLANT
PROTECTOR NERVE ULNAR (MISCELLANEOUS) ×2 IMPLANT
SET HNDPC FAN SPRY TIP SCT (DISPOSABLE) ×1 IMPLANT
SET PAD KNEE POSITIONER (MISCELLANEOUS) ×2 IMPLANT
SUT MNCRL AB 4-0 PS2 18 (SUTURE) ×2 IMPLANT
SUT STRATAFIX PDS+ 0 24IN (SUTURE) ×2 IMPLANT
SUT VIC AB 1 CT1 36 (SUTURE) ×2 IMPLANT
SUT VIC AB 2-0 CT1 27 (SUTURE) ×6
SUT VIC AB 2-0 CT1 TAPERPNT 27 (SUTURE) ×3 IMPLANT
SYR 3ML LL SCALE MARK (SYRINGE) ×2 IMPLANT
TIBIAL BASE ROT PLAT SZ 7 KNEE (Knees) ×2 IMPLANT
TRAY FOLEY MTR SLVR 16FR STAT (SET/KITS/TRAYS/PACK) ×2 IMPLANT
TUBE SUCTION HIGH CAP CLEAR NV (SUCTIONS) ×2 IMPLANT
WATER STERILE IRR 1000ML POUR (IV SOLUTION) ×4 IMPLANT
WRAP KNEE MAXI GEL POST OP (GAUZE/BANDAGES/DRESSINGS) ×2 IMPLANT

## 2021-03-14 NOTE — Interval H&P Note (Signed)
History and Physical Interval Note:  03/14/2021 12:12 PM  Michael Tapia  has presented today for surgery, with the diagnosis of Right knee osteoarthritis.  The various methods of treatment have been discussed with the patient and family. After consideration of risks, benefits and other options for treatment, the patient has consented to  Procedure(s) with comments: TOTAL KNEE ARTHROPLASTY (Right) - 70 mins as a surgical intervention.  The patient's history has been reviewed, patient examined, no change in status, stable for surgery.  I have reviewed the patient's chart and labs.  Questions were answered to the patient's satisfaction.     [name]

## 2021-03-14 NOTE — Anesthesia Preprocedure Evaluation (Addendum)
Anesthesia Evaluation  Patient identified by MRN, date of birth, ID band Patient awake    Reviewed: Allergy & Precautions, NPO status , Patient's Chart, lab work & pertinent test results  History of Anesthesia Complications Negative for: history of anesthetic complications  Airway Mallampati: II  TM Distance: >3 FB Neck ROM: Full    Dental  (+) Missing,    Pulmonary former smoker,    Pulmonary exam normal        Cardiovascular hypertension, Pt. on medications Normal cardiovascular exam     Neuro/Psych negative neurological ROS  negative psych ROS   GI/Hepatic Neg liver ROS, hiatal hernia, GERD  Medicated and Controlled,  Endo/Other  diabetes, Type 2, Oral Hypoglycemic Agents  Renal/GU negative Renal ROS  negative genitourinary   Musculoskeletal negative musculoskeletal ROS (+)   Abdominal   Peds  Hematology negative hematology ROS (+)   Anesthesia Other Findings Day of surgery medications reviewed with patient.  Reproductive/Obstetrics negative OB ROS                            Anesthesia Physical Anesthesia Plan  ASA: II  Anesthesia Plan: Spinal   Post-op Pain Management:  Regional for Post-op pain   Induction:   PONV Risk Score and Plan: 2 and Treatment may vary due to age or medical condition, Ondansetron, Propofol infusion and Dexamethasone  Airway Management Planned: Natural Airway and Simple Face Mask  Additional Equipment: None  Intra-op Plan:   Post-operative Plan:   Informed Consent: I have reviewed the patients History and Physical, chart, labs and discussed the procedure including the risks, benefits and alternatives for the proposed anesthesia with the patient or authorized representative who has indicated his/her understanding and acceptance.       Plan Discussed with: CRNA  Anesthesia Plan Comments:        Anesthesia Quick Evaluation

## 2021-03-14 NOTE — Transfer of Care (Signed)
Immediate Anesthesia Transfer of Care Note  Patient: Michael Tapia  Procedure(s) Performed: TOTAL KNEE ARTHROPLASTY (Right Knee)  Patient Location: PACU  Anesthesia Type:Spinal  Level of Consciousness: sedated, patient cooperative and responds to stimulation  Airway & Oxygen Therapy: Patient Spontanous Breathing and Patient connected to face mask oxygen  Post-op Assessment: Report given to RN and Post -op Vital signs reviewed and stable  Post vital signs: Reviewed and stable  Last Vitals:  Vitals Value Taken Time  BP 97/58 03/14/21 1702  Temp    Pulse 77 03/14/21 1704  Resp 16 03/14/21 1704  SpO2 95 % 03/14/21 1704  Vitals shown include unvalidated device data.  Last Pain:  Vitals:   03/14/21 1156  TempSrc: Oral  PainSc:          Complications: No complications documented.

## 2021-03-14 NOTE — Care Plan (Signed)
Ortho Bundle Case Management Note  Patient Details  Name: Michael Tapia MRN: [id number] Date of Birth: 1951-08-28  R TKA on 03-14-21 DCP:  Home with wife.  1 story home with 2 ste. DME:  RW and 3-in-1 ordered through [facility]. PT:  [facility].  PT eval scheduled on 03-16-21.                   DME Arranged:  Gilford Rile rolling,3-N-1 DME Agency:  [employer]  HH Arranged:  NA [place] Agency:  NA  Additional Comments: Please contact me with any questions of if this plan should need to change.  Marianne Sofia, RN,CCM [facility]  [phone] 03/14/2021, 12:54 PM

## 2021-03-14 NOTE — Anesthesia Procedure Notes (Signed)
Anesthesia Regional Block: Adductor canal block   Pre-Anesthetic Checklist: ,, timeout performed, Correct Patient, Correct Site, Correct Laterality, Correct Procedure, Correct Position, site marked, Risks and benefits discussed, pre-op evaluation,  At surgeon's request and post-op pain management  Laterality: Right  Prep: Maximum Sterile Barrier Precautions used, chloraprep       Needles:  Injection technique: Single-shot  Needle Type: Echogenic Stimulator Needle     Needle Length: 9cm  Needle Gauge: 22     Additional Needles:   Procedures:,,,, ultrasound used (permanent image in chart),,,,  Narrative:  Start time: 03/14/2021 2:55 PM End time: 03/14/2021 2:57 PM Injection made incrementally with aspirations every 5 mL.  Performed by: Personally  Anesthesiologist: Brennan Bailey, MD  Additional Notes: Risks, benefits, and alternative discussed. Patient gave consent for procedure. Patient prepped and draped in sterile fashion. Sedation administered, patient remains easily responsive to voice. Relevant anatomy identified with ultrasound guidance. Local anesthetic given in 5cc increments with no signs or symptoms of intravascular injection. No pain or paraesthesias with injection. Patient monitored throughout procedure with signs of LAST or immediate complications. Tolerated well. Ultrasound image placed in chart.  Tawny Asal, MD

## 2021-03-14 NOTE — Anesthesia Procedure Notes (Signed)
Spinal  Patient location during procedure: OR Start time: 03/14/2021 3:05 PM End time: 03/14/2021 3:08 PM Staffing Performed: anesthesiologist  Anesthesiologist: Brennan Bailey, MD Preanesthetic Checklist Completed: patient identified, IV checked, risks and benefits discussed, surgical consent, monitors and equipment checked, pre-op evaluation and timeout performed Spinal Block Patient position: sitting Prep: DuraPrep and site prepped and draped Patient monitoring: continuous pulse ox, blood pressure and heart rate Approach: midline Location: L3-4 Injection technique: single-shot Needle Needle type: Pencan  Needle gauge: 24 G Needle length: 9 cm Additional Notes Risks, benefits, and alternative discussed. Patient gave consent to procedure. Prepped and draped in sitting position. Patient sedated but responsive to voice. Clear CSF obtained after one needle pass. Positive terminal aspiration. No pain or paraesthesias with injection. Patient tolerated procedure well. Vital signs stable. Tawny Asal, MD

## 2021-03-14 NOTE — Op Note (Signed)
NAME:  Michael Tapia                      MEDICAL RECORD NO.:  [id number]                             FACILITY:  [facility]      PHYSICIAN:  Pietro Cassis. Alvan Dame, M.D.  DATE OF BIRTH:  1951-06-05      DATE OF PROCEDURE:  03/14/2021                                     OPERATIVE REPORT         PREOPERATIVE DIAGNOSIS:  Right knee osteoarthritis.      POSTOPERATIVE DIAGNOSIS:  Right knee osteoarthritis.      FINDINGS:  The patient was noted to have complete loss of cartilage and   bone-on-bone arthritis with associated osteophytes in the medial and patellofemoral compartments of   the knee with a significant synovitis and associated effusion.  The patient had failed months of conservative treatment including medications, injection therapy, activity modification.     PROCEDURE:  Right total knee replacement.      COMPONENTS USED:  DePuy Attune rotating platform posterior stabilized knee   system, a size 7 femur, 7 tibia, size 6 mm PS AOX insert, and 38 anatomic patellar   button.      SURGEON:  Pietro Cassis. Alvan Dame, M.D.      ASSISTANT:  Danae Orleans, PA-C.      ANESTHESIA:  Regional and Spinal.      SPECIMENS:  None.      COMPLICATION:  None.      DRAINS:  None.  EBL: <100 cc      TOURNIQUET TIME:  32 at 250 mmHg     The patient was stable to the recovery room.      INDICATION FOR PROCEDURE:  Michael Tapia is a 70 y.o. male patient of   mine.  The patient had been seen, evaluated, and treated for months conservatively in the   office with medication, activity modification, and injections.  The patient had   radiographic changes of bone-on-bone arthritis with endplate sclerosis and osteophytes noted.  Based on the radiographic changes and failed conservative measures, the patient   decided to proceed with definitive treatment, total knee replacement.  Risks of infection, DVT, component failure, need for revision surgery, neurovascular injury were reviewed in the office setting.  The  postop course was reviewed stressing the efforts to maximize post-operative satisfaction and function.  Consent was obtained for benefit of pain   relief.      PROCEDURE IN DETAIL:  The patient was brought to the operative theater.   Once adequate anesthesia, preoperative antibiotics, 1 gm of Vancomycin and weight dose Gentamycin,1 gm of Tranexamic Acid, and 10 mg of Decadron administered, the patient was positioned supine with a right thigh tourniquet placed.  The  right lower extremity was prepped and draped in sterile fashion.  A time-   out was performed identifying the patient, planned procedure, and the appropriate extremity.      The right lower extremity was placed in the [facility] leg holder.  The leg was   exsanguinated, tourniquet elevated to 250 mmHg.  A midline incision was   made followed by median parapatellar arthrotomy.  Following initial   exposure, attention  was first directed to the patella.  Precut   measurement was noted to be 24 mm.  I resected down to 13 mm and used a   38 anatomic patellar button to restore patellar height as well as cover the cut surface.      The lug holes were drilled and a metal shim was placed to protect the   patella from retractors and saw blade during the procedure.      At this point, attention was now directed to the femur.  The femoral   canal was opened with a drill, irrigated to try to prevent fat emboli.  An   intramedullary rod was passed at 5 degrees valgus, 9 mm of bone was   resected off the distal femur.  Following this resection, the tibia was   subluxated anteriorly.  Using the extramedullary guide, 2 mm of bone was resected off   the proximal medial tibia.  We confirmed the gap would be   stable medially and laterally with a size 5 spacer block as well as confirmed that the tibial cut was perpendicular in the coronal plane, checking with an alignment rod.      Once this was done, I sized the femur to be a size 7 in the anterior-    posterior dimension, chose a standard component based on medial and   lateral dimension.  The size 7 rotation block was then pinned in   position anterior referenced using the C-clamp to set rotation.  The   anterior, posterior, and  chamfer cuts were made without difficulty nor   notching making certain that I was along the anterior cortex to help   with flexion gap stability.      The final box cut was made off the lateral aspect of distal femur.      At this point, the tibia was sized to be a size 7.  The size 7 tray was   then pinned in position through the medial third of the tubercle,   drilled, and keel punched.  Trial reduction was now carried with a 7 femur,  7 tibia, a size 6 mm PS insert, and the 38 anatomic patella botton.  The knee was brought to full extension with good flexion stability with the patella   tracking through the trochlea without application of pressure.  Given   all these findings the trial components removed.  Final components were   opened and cement was mixed.  The knee was irrigated with normal saline solution and pulse lavage.  The synovial lining was   then injected with 30 cc of 0.25% Marcaine with epinephrine, 1 cc of Toradol and 30 cc of NS for a total of 61 cc.     Final implants were then cemented onto cleaned and dried cut surfaces of bone with the knee brought to extension with a size 6 mm PS trial insert.      Once the cement had fully cured, excess cement was removed   throughout the knee.  I confirmed that I was satisfied with the range of   motion and stability, and the final size 6 mm PS AOX insert was chosen.  It was   placed into the knee.      The tourniquet had been let down at 32 minutes.  No significant   hemostasis was required.  The extensor mechanism was then reapproximated using #1 Vicryl and #1 Stratafix sutures with the knee   in flexion.  The  remaining wound was closed with 2-0 Vicryl and running 4-0 Monocryl.   The knee  was cleaned, dried, dressed sterilely using Dermabond and   Aquacel dressing.  The patient was then   brought to recovery room in stable condition, tolerating the procedure   well.   Please note that Physician Assistant, Danae Orleans, PA-C was present for the entirety of the case, and was utilized for pre-operative positioning, peri-operative retractor management, general facilitation of the procedure and for primary wound closure at the end of the case.              Pietro Cassis Alvan Dame, M.D.    03/14/2021 3:09 PM

## 2021-03-14 NOTE — Progress Notes (Signed)
Assisted Dr.Kathryn [facility] with right adductor canal  block. Side rails up, monitors on throughout procedure. See vital signs in flow sheet. Tolerated Procedure well.

## 2021-03-14 NOTE — Anesthesia Postprocedure Evaluation (Signed)
Anesthesia Post Note  Patient: Ajmal Kathan  Procedure(s) Performed: TOTAL KNEE ARTHROPLASTY (Right Knee)     Patient location during evaluation: PACU Anesthesia Type: Spinal Level of consciousness: awake and alert and oriented Pain management: pain level controlled Vital Signs Assessment: post-procedure vital signs reviewed and stable Respiratory status: spontaneous breathing, nonlabored ventilation and respiratory function stable Cardiovascular status: blood pressure returned to baseline Postop Assessment: no apparent nausea or vomiting, spinal receding, no headache and no backache Anesthetic complications: no   No complications documented.  Last Vitals:  Vitals:   03/14/21 1930 03/14/21 2018  BP: 132/68 (!) 141/74  Pulse: (!) 53 (!) 57  Resp: 15 18  Temp:  (!) 36.3 C  SpO2: 98% 99%    Last Pain:  Vitals:   03/14/21 2018  TempSrc: Oral  PainSc:                  Brennan Bailey

## 2021-03-14 NOTE — Discharge Instructions (Signed)

## 2021-03-15 ENCOUNTER — Encounter (HOSPITAL_COMMUNITY): Payer: Self-pay | Admitting: Orthopedic Surgery

## 2021-03-15 DIAGNOSIS — E669 Obesity, unspecified: Secondary | ICD-10-CM | POA: Diagnosis present

## 2021-03-15 DIAGNOSIS — E1110 Type 2 diabetes mellitus with ketoacidosis without coma: Secondary | ICD-10-CM | POA: Diagnosis not present

## 2021-03-15 DIAGNOSIS — Z79899 Other long term (current) drug therapy: Secondary | ICD-10-CM | POA: Diagnosis not present

## 2021-03-15 DIAGNOSIS — M1711 Unilateral primary osteoarthritis, right knee: Secondary | ICD-10-CM | POA: Diagnosis not present

## 2021-03-15 DIAGNOSIS — I10 Essential (primary) hypertension: Secondary | ICD-10-CM | POA: Diagnosis not present

## 2021-03-15 DIAGNOSIS — Z7984 Long term (current) use of oral hypoglycemic drugs: Secondary | ICD-10-CM | POA: Diagnosis not present

## 2021-03-15 DIAGNOSIS — Z7982 Long term (current) use of aspirin: Secondary | ICD-10-CM | POA: Diagnosis not present

## 2021-03-15 LAB — CBC
HCT: 37.4 % — ABNORMAL LOW (ref 39.0–52.0)
Hemoglobin: 13 g/dL (ref 13.0–17.0)
MCH: 30.7 pg (ref 26.0–34.0)
MCHC: 34.8 g/dL (ref 30.0–36.0)
MCV: 88.2 fL (ref 80.0–100.0)
Platelets: 143 10*3/uL — ABNORMAL LOW (ref 150–400)
RBC: 4.24 MIL/uL (ref 4.22–5.81)
RDW: 12.7 % (ref 11.5–15.5)
WBC: 8.6 10*3/uL (ref 4.0–10.5)
nRBC: 0 % (ref 0.0–0.2)

## 2021-03-15 LAB — BASIC METABOLIC PANEL
Anion gap: 11 (ref 5–15)
BUN: 22 mg/dL (ref 8–23)
CO2: 18 mmol/L — ABNORMAL LOW (ref 22–32)
Calcium: 8.7 mg/dL — ABNORMAL LOW (ref 8.9–10.3)
Chloride: 107 mmol/L (ref 98–111)
Creatinine, Ser: 1.02 mg/dL (ref 0.61–1.24)
GFR, Estimated: >60 mL/min (ref >60)
Glucose, Bld: 153 mg/dL — ABNORMAL HIGH (ref 70–99)
Potassium: 4 mmol/L (ref 3.5–5.1)
Sodium: 136 mmol/L (ref 135–145)

## 2021-03-15 LAB — GLUCOSE, CAPILLARY: Glucose-Capillary: 111 mg/dL — ABNORMAL HIGH (ref 70–99)

## 2021-03-15 MED ORDER — DOCUSATE SODIUM 100 MG PO CAPS: 100.0000 mg | ORAL_CAPSULE | Freq: Two times a day (BID) | ORAL | 0 refills | Status: DC

## 2021-03-15 MED ORDER — POLYETHYLENE GLYCOL 3350 17 G PO PACK: 17.0000 g | PACK | Freq: Two times a day (BID) | ORAL | 0 refills | Status: DC

## 2021-03-15 MED ORDER — HYDROCODONE-ACETAMINOPHEN 7.5-325 MG PO TABS: 1.0000 | ORAL_TABLET | ORAL | 0 refills | Status: DC | PRN

## 2021-03-15 MED ORDER — CELECOXIB 200 MG PO CAPS: 200.0000 mg | ORAL_CAPSULE | Freq: Two times a day (BID) | ORAL | 0 refills | Status: DC

## 2021-03-15 MED ORDER — METHOCARBAMOL 500 MG PO TABS: 500.0000 mg | ORAL_TABLET | Freq: Four times a day (QID) | ORAL | 0 refills | Status: DC | PRN

## 2021-03-15 MED ORDER — ASPIRIN 81 MG PO CHEW: 81.0000 mg | CHEWABLE_TABLET | Freq: Two times a day (BID) | ORAL | 0 refills | Status: AC

## 2021-03-15 MED ORDER — FERROUS SULFATE 325 (65 FE) MG PO TABS: 325.0000 mg | ORAL_TABLET | Freq: Three times a day (TID) | ORAL | 0 refills | Status: DC

## 2021-03-15 NOTE — TOC Transition Note (Signed)
Transition of Care [facility]) - CM/SW Discharge Note   Patient Details  Name: Norm Wray MRN: [id number] Date of Birth: 17-Sep-1951  [facility]) CM/SW Contact:  Lennart Pall, LCSW Phone Number: 03/15/2021, 10:23 AM   Clinical Narrative:    Met briefly with pt and wife and confirming DME to be delivered by [employer] (see below).  Plan for OPPT at [facility] in [place].  No TOC needs.   Final next level of care: OP Rehab Barriers to Discharge: No Barriers Identified   Patient Goals and CMS Choice Patient states their goals for this hospitalization and ongoing recovery are:: return home      Discharge Placement                       Discharge Plan and Services                DME Arranged: Gilford Rile rolling,3-N-1 DME Agency: [employer]       HH Arranged: NA HH Agency: NA        Social Determinants of Health (SDOH) Interventions     Readmission Risk Interventions No flowsheet data found.

## 2021-03-15 NOTE — Progress Notes (Signed)
     Subjective: 1 Day Post-Op Procedure(s) (LRB): TOTAL KNEE ARTHROPLASTY (Right)   Seen by Dr. Alvan Dame. Patient reports pain as mild, pain controlled. No reported events throughout the night.  Dr. Alvan Dame discussed the procedure, findings and expectations moving forward.  Ready to be discharged home, if they do well with PT.  Follow up in the clinic in 2 weeks.  Knows to call with any questions or concerns.     Patient's anticipated LOS is less than 2 midnights, meeting these requirements: - Lives within 1 hour of care - Has a competent adult at home to recover with post-op recover - NO history of  - Chronic pain requiring opiods  - Coronary Artery Disease  - Heart failure  - Heart attack  - Stroke  - DVT/VTE  - Cardiac arrhythmia  - Respiratory Failure/COPD  - Renal failure  - Anemia  - Advanced Liver disease       Objective:   VITALS:   Vitals:   03/15/21 0143 03/15/21 0455  BP: 135/83 (!) 150/75  Pulse: 63 69  Resp: 17 17  Temp: (!) 97.4 F (36.3 C) 98.5 F (36.9 C)  SpO2: 96% 97%    Dorsiflexion/Plantar flexion intact Incision: dressing C/D/I No cellulitis present Compartment soft  LABS Recent Labs    03/15/21 0257  HGB 13.0  HCT 37.4*  WBC 8.6  PLT 143*    Recent Labs    03/15/21 0257  NA 136  K 4.0  BUN 22  CREATININE 1.02  GLUCOSE 153*     Assessment/Plan: 1 Day Post-Op Procedure(s) (LRB): TOTAL KNEE ARTHROPLASTY (Right) Foley cath d/c'ed Advance diet Up with therapy D/C IV fluids Discharge home  Follow up in 2 [facility] at [facility] Follow up with OLIN,Saahas Hidrogo D in 2 weeks.  Contact information:  [facility] [address], [address] [place] [phone]    Obese (BMI 30-39.9) Estimated body mass index is 32.08 kg/m as calculated from the following:   Height as of this encounter: 5\' 11"  (1.803 m).   Weight as of this encounter: 104.3 kg. Patient also counseled that weight may inhibit the healing  process Patient counseled that losing weight will help with future health issues       Danae Orleans PA-C  [facility]  [place] [address], [place], [place] [address] Phone: [phone] [email or web address] Facebook  [employer]

## 2021-03-15 NOTE — Progress Notes (Addendum)
Physical Therapy Treatment Patient Details Name: Michael Tapia MRN: [id number] DOB: 1951-05-18 Today's Date: 03/15/2021    History of Present Illness 70 y.o. male admitted for R TKA on 03/14/21. PMH includes DM, spinal stenosis, back surgery 5462, metabolic syndrome, GERD.    PT Comments    Pt ambulated 120' with RW, no loss of balance. Stair training completed. Pt demonstrates good understanding of HEP and is ready to DC home from PT standpoint.   Follow Up Recommendations  Follow surgeon's recommendation for DC plan and follow-up therapies;Outpatient PT     Equipment Recommendations  Rolling walker with 5" wheels;3in1 (PT)    Recommendations for Other Services       Precautions / Restrictions Precautions Precautions: Knee Precaution Booklet Issued: Yes (comment) Precaution Comments: reviewed no pillow under knee Restrictions Weight Bearing Restrictions: No RLE Weight Bearing: Weight bearing as tolerated    Mobility  Bed Mobility Overal bed mobility: Modified Independent             General bed mobility comments: HOB up, used rail    Transfers Overall transfer level: Needs assistance Equipment used: Rolling walker (2 wheeled) Transfers: Sit to/from Stand Sit to Stand: Supervision         General transfer comment: VCs hand placement  Ambulation/Gait Ambulation/Gait assistance: Supervision Gait Distance (Feet): 120 Feet Assistive device: Rolling walker (2 wheeled) Gait Pattern/deviations: Step-to pattern;Decreased step length - right;Decreased step length - left     General Gait Details: VCs sequencing   Stairs Stairs: Yes Stairs assistance: Min assist Stair Management: No rails;Backwards;Step to pattern Number of Stairs: 3 General stair comments: min A to manage/steady RW; wife present   Wheelchair Mobility    Modified Rankin (Stroke Patients Only)       Balance Overall balance assessment: Modified Independent                                           Cognition Arousal/Alertness: Awake/alert Behavior During Therapy: WFL for tasks assessed/performed Overall Cognitive Status: Within Functional Limits for tasks assessed                                        Exercises Total Joint Exercises Ankle Circles/Pumps: AROM;Both;10 reps;Supine Quad Sets: AROM;Both;5 reps;Supine Short Arc Quad: AROM;Right;5 reps;Supine Heel Slides: AAROM;Right;5 reps;Supine Hip ABduction/ADduction: AAROM;Right;5 reps;Supine Straight Leg Raises: AROM;Right;5 reps;Supine Long Arc Quad: AROM;Right;5 reps;Seated Knee Flexion: AAROM;Right;10 reps;Seated Goniometric ROM: 5-90* AAROM R knee    General Comments        Pertinent Vitals/Pain Pain Assessment: 0-10 Pain Score: 2  Pain Location: R TKA Pain Descriptors / Indicators: Tightness Pain Intervention(s): Limited activity within patient's tolerance;Monitored during session;Premedicated before session;Ice applied    Home Living Family/patient expects to be discharged to:: Private residence Living Arrangements: Spouse/significant other Available Help at Discharge: Family;Available 24 hours/day Type of Home: House Home Access: Stairs to enter Entrance Stairs-Rails: None Home Layout: One level Home Equipment: None      Prior Function Level of Independence: Independent          PT Goals (current goals can now be found in the care plan section) Acute Rehab PT Goals Patient Stated Goal: fishing PT Goal Formulation: All assessment and education complete, DC therapy    Frequency  PT Plan      Co-evaluation              AM-PAC PT "6 Clicks" Mobility   Outcome Measure  Help needed turning from your back to your side while in a flat bed without using bedrails?: None Help needed moving from lying on your back to sitting on the side of a flat bed without using bedrails?: A Little Help needed moving to and from a bed to a chair  (including a wheelchair)?: None Help needed standing up from a chair using your arms (e.g., wheelchair or bedside chair)?: None Help needed to walk in hospital room?: None Help needed climbing 3-5 steps with a railing? : A Little 6 Click Score: 22    End of Session Equipment Utilized During Treatment: Gait belt Activity Tolerance: Patient tolerated treatment well Patient left: in chair;with call bell/phone within reach;with family/visitor present Nurse Communication: Mobility status PT Visit Diagnosis: Difficulty in walking, not elsewhere classified (R26.2);Pain Pain - Right/Left: Right Pain - part of body: Knee     Time: 1855-0158 PT Time Calculation (min) (ACUTE ONLY): 33 min  Charges:  $Gait Training: 8-22 mins $Eval -low complexity 8-22 mins                     Blondell Reveal Kistler PT 03/15/2021  Acute Rehabilitation Services Pager [phone] Office [phone]

## 2021-03-15 NOTE — Discharge Summary (Cosign Needed Addendum)
Patient ID: Michael Tapia MRN: [id number] DOB/AGE: 72/10/1951 72 y.o.  Admit date: 03/14/2021 Discharge date: 03/15/2021  Admission Diagnoses:  Principal Problem:   Osteoarthritis of right knee Active Problems:   Status post total right knee replacement   Discharge Diagnoses:  Same  Past Medical History:  Diagnosis Date  . Arthritis    knees  . BPH (benign prostatic hyperplasia)   . Diabetes mellitus without complication ([place])    Type II  . GERD (gastroesophageal reflux disease)   . History of hiatal hernia   . Hyperlipidemia   . Hypertension   . Lumbar stenosis with neurogenic claudication   . Metabolic syndrome     Surgeries: Procedure(s): RIGHT TOTAL KNEE ARTHROPLASTY on 03/14/2021   Consultants: N/A  Discharged Condition: Improved  Hospital Course: Michael Tapia is an 70 y.o. male who was admitted 03/14/2021 for operative treatment ofOsteoarthritis of right knee. Patient has severe unremitting pain that affects sleep, daily activities, and work/hobbies. After pre-op clearance the patient was taken to the operating room on 03/14/2021 and underwent  Procedure(s): RIGHT TOTAL KNEE ARTHROPLASTY.    Patient was given perioperative antibiotics:  Anti-infectives (From admission, onward)   Start     Dose/Rate Route Frequency Ordered Stop   03/15/21 0300  vancomycin (VANCOREADY) IVPB 1000 mg/200 mL        1,000 mg 200 mL/hr over 60 Minutes Intravenous Every 12 hours 03/14/21 2023 03/15/21 0312   03/14/21 1130  vancomycin (VANCOCIN) IVPB 1000 mg/200 mL premix        1,000 mg 200 mL/hr over 60 Minutes Intravenous On call to O.R. 03/14/21 1121 03/14/21 1446   03/14/21 0000  gentamicin (GARAMYCIN) 440 mg in dextrose 5 % 100 mL IVPB        5 mg/kg  87.3 kg (Adjusted) 111 mL/hr over 60 Minutes Intravenous 60 min pre-op 03/13/21 0743 03/14/21 1601       Patient was given sequential compression devices, early ambulation, and chemoprophylaxis to prevent DVT.  Patient  benefited maximally from hospital stay and there were no complications.    Recent vital signs:  Patient Vitals for the past 24 hrs:  BP Temp Temp src Pulse Resp SpO2 Height Weight  03/15/21 0455 (!) 150/75 98.5 F (36.9 C) Oral 69 17 97 % - -  03/15/21 0143 135/83 (!) 97.4 F (36.3 C) Oral 63 17 96 % - -  03/14/21 2137 128/81 (!) 97.5 F (36.4 C) Oral 62 16 97 % - -  03/14/21 2018 (!) 141/74 (!) 97.4 F (36.3 C) Oral (!) 57 18 99 % - -  03/14/21 2000 124/74 97.7 F (36.5 C) - (!) 55 14 100 % - -  03/14/21 1930 132/68 - - (!) 53 15 98 % - -  03/14/21 1915 122/70 - - (!) 59 18 96 % - -  03/14/21 1900 121/71 - - (!) 50 14 98 % - -  03/14/21 1845 119/71 - - (!) 53 (!) 23 97 % - -  03/14/21 1830 110/74 - - (!) 58 16 96 % - -  03/14/21 1815 109/68 - - 69 17 97 % - -  03/14/21 1800 102/65 - - (!) 54 17 99 % - -  03/14/21 1745 105/67 - - 70 (!) 24 98 % - -  03/14/21 1730 94/62 - - 61 17 97 % - -  03/14/21 1715 100/60 - - 67 (!) 21 95 % - -  03/14/21 1702 (!) 97/58 (!) 97.2 F (36.2 C) - 79  19 95 % - -  03/14/21 1501 - - - 61 (!) 21 100 % - -  03/14/21 1456 (!) 149/81 - - 65 17 100 % - -  03/14/21 1156 (!) 160/72 97.9 F (36.6 C) Oral 72 14 95 % - -  03/14/21 1149 - - - - - - 5\' 11"  (1.803 m) 104.3 kg     Recent laboratory studies:  Recent Labs    03/15/21 0257  WBC 8.6  HGB 13.0  HCT 37.4*  PLT 143*  NA 136  K 4.0  CL 107  CO2 18*  BUN 22  CREATININE 1.02  GLUCOSE 153*  CALCIUM 8.7*     Discharge Medications:   Allergies as of 03/15/2021      Reactions   Bee Venom Anaphylaxis   Ivp Dye [iodinated Diagnostic Agents] Anaphylaxis   Penicillins Anaphylaxis   .Did it involve swelling of the face/tongue/throat, SOB, or low BP? Yes Did it involve sudden or severe rash/hives, skin peeling, or any reaction on the inside of your mouth or nose? Yes Did you need to seek medical attention at a hospital or doctor's office? Yes When did it last happen? If all above  answers are "NO", may proceed with cephalosporin use.      Medication List    STOP taking these medications   aspirin 81 MG tablet Replaced by: aspirin 81 MG chewable tablet     TAKE these medications   aspirin 81 MG chewable tablet Commonly known as: Aspirin Childrens Chew 1 tablet (81 mg total) by mouth 2 (two) times daily. Take for 4 weeks, then resume regular dose. Replaces: aspirin 81 MG tablet   celecoxib 200 MG capsule Commonly known as: CeleBREX Take 1 capsule (200 mg total) by mouth 2 (two) times daily.   Coenzyme Q10 100 MG capsule Take 100 mg by mouth daily.   docusate sodium 100 MG capsule Commonly known as: Colace Take 1 capsule (100 mg total) by mouth 2 (two) times daily.   Dutasteride-Tamsulosin HCl 0.5-0.4 MG Caps Take 1 capsule by mouth daily.   ferrous sulfate 325 (65 FE) MG tablet Commonly known as: FerrouSul Take 1 tablet (325 mg total) by mouth 3 (three) times daily with meals for 14 days.   Fish Oil 1000 MG Caps Take 1 capsule by mouth daily.   FLAXSEED (LINSEED) PO Take 1 capsule by mouth daily.   Flaxseed Oil 1400 MG Caps Take 1,400 mg by mouth daily.   GINSENG-COMPLEX PO Take 1 capsule by mouth daily.   glucose blood test strip Commonly known as: ONE TOUCH ULTRA TEST Use daily   HYDROcodone-acetaminophen 7.5-325 MG tablet Commonly known as: Norco Take 1-2 tablets by mouth every 4 (four) hours as needed for moderate pain.   losartan 25 MG tablet Commonly known as: COZAAR Take 25 mg by mouth daily.   methocarbamol 500 MG tablet Commonly known as: Robaxin Take 1 tablet (500 mg total) by mouth every 6 (six) hours as needed for muscle spasms.   omeprazole 20 MG capsule Commonly known as: PRILOSEC Take 1 capsule (20 mg total) by mouth daily.   OneTouch Delica Lancets 58N Misc 1 each by Does not apply route every morning.   Pitavastatin Calcium 4 MG Tabs Take 4 mg by mouth daily.   polyethylene glycol 17 g packet Commonly  known as: MIRALAX / GLYCOLAX Take 17 g by mouth 2 (two) times daily.   sitaGLIPtin-metformin 50-1000 MG tablet Commonly known as: Janumet TAKE 1 TABLET once  a day What changed:   how much to take  how to take this  when to take this  additional instructions   tadalafil 5 MG tablet Commonly known as: CIALIS Take 5 mg by mouth every morning.   Turmeric 500 MG Caps Take 500 mg by mouth daily.   Vitamin D-3 125 MCG (5000 UT) Tabs Take 5,000 Units by mouth daily.   vitamin E 180 MG (400 UNITS) capsule Take 400 Units by mouth daily.            Discharge Care Instructions  (From admission, onward)         Start     Ordered   03/15/21 0000  Change dressing       Comments: Maintain surgical dressing until follow up in the clinic. If the edges start to pull up, may reinforce with tape. If the dressing is no longer working, may remove and cover with gauze and tape, but must keep the area dry and clean.  Call with any questions or concerns.   03/15/21 0759          Diagnostic Studies: No results found.  Disposition: HOME  Discharge Instructions    Call MD / Call 911   Complete by: As directed    If you experience chest pain or shortness of breath, CALL 911 and be transported to the hospital emergency room.  If you develope a fever above 101 F, pus (white drainage) or increased drainage or redness at the wound, or calf pain, call your surgeon's office.   Change dressing   Complete by: As directed    Maintain surgical dressing until follow up in the clinic. If the edges start to pull up, may reinforce with tape. If the dressing is no longer working, may remove and cover with gauze and tape, but must keep the area dry and clean.  Call with any questions or concerns.   Constipation Prevention   Complete by: As directed    Drink plenty of fluids.  Prune juice may be helpful.  You may use a stool softener, such as Colace (over the counter) 100 mg twice a day.  Use MiraLax  (over the counter) for constipation as needed.   Diet - low sodium heart healthy   Complete by: As directed    Discharge instructions   Complete by: As directed    Maintain surgical dressing until follow up in the clinic. If the edges start to pull up, may reinforce with tape. If the dressing is no longer working, may remove and cover with gauze and tape, but must keep the area dry and clean.  Follow up in 2 weeks at [facility]. Call with any questions or concerns.   Increase activity slowly as tolerated   Complete by: As directed    Weight bearing as tolerated with assist device (walker, cane, etc) as directed, use it as long as suggested by your surgeon or therapist, typically at least 4-6 weeks.   TED hose   Complete by: As directed    Use stockings (TED hose) for 2 weeks on both leg(s).  You may remove them at night for sleeping.       Follow-up Information    Paralee Cancel, MD. Go on 03/29/2021.   Specialty: Orthopedic Surgery Why: You are scheduled for a post-operative appointment on 03-29-21 at 3:45 pm.  Contact information: [address] [place] [address] [phone]  Signed: Lucille Passy [facility] 03/15/2021, 7:59 AM

## 2021-03-16 ENCOUNTER — Other Ambulatory Visit: Payer: Self-pay

## 2021-03-16 ENCOUNTER — Encounter: Payer: Self-pay | Admitting: Physical Therapy

## 2021-03-16 ENCOUNTER — Ambulatory Visit: Payer: Medicare Other | Attending: Orthopedic Surgery | Admitting: Physical Therapy

## 2021-03-16 DIAGNOSIS — G8929 Other chronic pain: Secondary | ICD-10-CM | POA: Diagnosis not present

## 2021-03-16 DIAGNOSIS — M25561 Pain in right knee: Secondary | ICD-10-CM | POA: Diagnosis not present

## 2021-03-16 DIAGNOSIS — M25661 Stiffness of right knee, not elsewhere classified: Secondary | ICD-10-CM | POA: Insufficient documentation

## 2021-03-16 DIAGNOSIS — M6281 Muscle weakness (generalized): Secondary | ICD-10-CM | POA: Insufficient documentation

## 2021-03-16 DIAGNOSIS — R600 Localized edema: Secondary | ICD-10-CM | POA: Diagnosis not present

## 2021-03-16 NOTE — Therapy (Signed)
[facility] [place], [place], [address] Phone: [phone]   Fax:  [phone]  Physical Therapy Evaluation  Patient Details  Name: Michael Tapia MRN: [id number] Date of Birth: April 27, 1951 Referring Provider (PT): Paralee Cancel MD   Encounter Date: 03/16/2021   PT End of Session - 03/16/21 1418    Visit Number 1    Number of Visits 12    Date for PT Re-Evaluation 04/13/21    Authorization Type FOTO AT LEAST EVERY 5TH VISIT.  PROGRESS NOTE AT 10TH VISIT.  KX MODIFIER AFTER 15 VISITS.    PT Start Time 1235    PT Stop Time 1323    PT Time Calculation (min) 48 min    Activity Tolerance Patient tolerated treatment well    Behavior During Therapy WFL for tasks assessed/performed           Past Medical History:  Diagnosis Date  . Arthritis    knees  . BPH (benign prostatic hyperplasia)   . Diabetes mellitus without complication ([place])    Type II  . GERD (gastroesophageal reflux disease)   . History of hiatal hernia   . Hyperlipidemia   . Hypertension   . Lumbar stenosis with neurogenic claudication   . Metabolic syndrome     Past Surgical History:  Procedure Laterality Date  . BACK SURGERY    . EYE SURGERY     Bilateral Cataracts  . LUMBAR LAMINECTOMY/DECOMPRESSION MICRODISCECTOMY Bilateral 10/28/2017   Procedure: Bilateral L3-4 L4-5 L5-S1 Laminotomy/Foraminotomy;  Surgeon: Kristeen Miss, MD;  Location: [facility];  Service: Neurosurgery;  Laterality: Bilateral;  Bilateral L3-4 L4-5 L5-S1 Laminotomy/Foraminotomy  . MANDIBLE FRACTURE SURGERY    . MENISCUS REPAIR     Left  . TONSILLECTOMY    . TOTAL KNEE ARTHROPLASTY Right 03/14/2021   Procedure: TOTAL KNEE ARTHROPLASTY;  Surgeon: Paralee Cancel, MD;  Location: [facility];  Service: Orthopedics;  Laterality: Right;  70 mins    There were no vitals filed for this visit.    Subjective Assessment - 03/16/21 1354    Subjective COVID-19 screen performed prior to patient  entering clinic. The patient presents to the clinic s/p right TKA performed on 03/14/21.  His pain is high today rated at an 8/10 due to not taking his pain medication.  His wife was going to the pharmacy today for him.  He feels he overdid the home exercises as well.  He appears to be highly motivated.  He has an 'EZY wrap" for cold therapy and an ACE wrap to keep swelling down.  He is compliant to TED hose use.  He is walking with a FWW.    Pertinent History Left knee surgery, back surgery, OA, OA, h/o hiatal hernia, DM, HTN, bee sting allergy, BPH.    How long can you walk comfortably? Around home.    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    Currently in Pain? Yes    Pain Score 8     Pain Location Knee    Pain Orientation Right    Pain Descriptors / Indicators Aching;Throbbing    Pain Type Surgical pain    Pain Onset In the past 7 days    Pain Frequency Constant    Aggravating Factors  Being up too much.   Overdoing exercises.    Pain Relieving Factors Ice.              [facility] PT Assessment - 03/16/21 0001  Assessment   Medical Diagnosis Right total knee replacement.    Referring Provider (PT) Paralee Cancel MD    Onset Date/Surgical Date --   03/14/21 (suregry date).     Precautions   Precaution Comments No ultrasound.  Bee sting allergy.      Restrictions   Weight Bearing Restrictions No      Balance Screen   Has the patient fallen in the past 6 months No    Has the patient had a decrease in activity level because of a fear of falling?  No    Is the patient reluctant to leave their home because of a fear of falling?  No      Home Environment   Living Environment Private residence      Observation/Other Assessments   Observations Aquacel intact over right anterior knee.      Observation/Other Assessments-Edema    Edema Circumferential      Circumferential Edema   Circumferential - Right RT 3 cms > LT.      ROM / Strength   AROM / PROM /  Strength AROM;Strength      AROM   Overall AROM Comments Right knee extension is -20 degrees actively in supine and passive to -15 degrees with active flexion to 110 degrees.      Strength   Overall Strength Comments The patient is able to perform an antigravity right SLR and knee extension strength is graded grossly at 4 to 4+/5.      Palpation   Palpation comment Diffuse c/o right knee pain anteriorly at this time.      Transfers   Comments Supervision.      Ambulation/Gait   Gait Comments The patient is walking with a FWW and his right knee held in flexion.                      Objective measurements completed on examination: See above findings.       [facility] Adult PT Treatment/Exercise - 03/16/21 0001      Exercises   Exercises Knee/Hip      Knee/Hip Exercises: Aerobic   Nustep Level 1 x 8 minutes.      Modalities   Modalities Vasopneumatic      Vasopneumatic   Number Minutes Vasopneumatic  15 minutes    Vasopnuematic Location  --   Right knee.   Vasopneumatic Pressure Medium                    PT Short Term Goals - 03/16/21 1519      PT SHORT TERM GOAL #1   Title ind with an initial HEP.    Time 2    Period Weeks    Status New      PT SHORT TERM GOAL #2   Title Full active right knee extension.    Time 2    Period Weeks    Status New             PT Long Term Goals - 03/16/21 1520      PT LONG TERM GOAL #1   Title Independent with an advanced HEP.    Time 4    Period Weeks    Status New      PT LONG TERM GOAL #2   Title Active right knee flexion to 125 degrees so the patient can perform functional tasks and do so with pain not > 2-3/10.    Time 4  Period Weeks    Status New      PT LONG TERM GOAL #3   Title Increase right knee strength to 5/5 to provide good stability for accomplishment of functional activities.    Time 6    Period Weeks    Status New      PT LONG TERM GOAL #4   Title Perform a reciprocating  stair gait with one railing with pain not > 2-3/10.    Time 4    Period Weeks    Status New      PT LONG TERM GOAL #5   Title Walk a community distance without assistive device.    Time 4    Period Weeks    Status New                  Plan - 03/16/21 1318    Clinical Impression Statement The patient presents to OPPT s/p right TKA performed on 03/14/21.  The patient is highly motivated.  He is lacking extension but his flexion is quite good for just two days post-op.  He and his wife were shown to extension stretches to begin at home.  He is easiliy perorming antigravity SLR's and right knee extension.  He is walking with a FWW with his right knee in flexion.  He has an expected amount of right knee edema.  Patient will benefit from skilled physical therapy intervention to address pain and deficits.    Personal Factors and Comorbidities Comorbidity 1;Comorbidity 2;Other    Comorbidities Left knee surgery, back surgery, OA, OA, h/o hiatal hernia, DM, HTN, bee sting allergy, BPH.    Examination-Activity Limitations Other;Locomotion Level    Examination-Participation Restrictions Other    Stability/Clinical Decision Making Stable/Uncomplicated    Clinical Decision Making Low    Rehab Potential Excellent    PT Frequency 3x / week    PT Duration 4 weeks    PT Treatment/Interventions ADLs/Self Care Home Management;Cryotherapy;Electrical Stimulation;Gait training;Stair training;Functional mobility training;Therapeutic activities;Therapeutic exercise;Neuromuscular re-education;Manual techniques;Patient/family education;Passive range of motion;Vasopneumatic Device    PT Next Visit Plan FOTO (already loaded)....Nustep and then progress to recumbent bike, VMS ro right quads during SAQ's.  PROM.  PRE's.  Vasopneumatic.    Consulted and Agree with Plan of Care Patient           Patient will benefit from skilled therapeutic intervention in order to improve the following deficits and  impairments:  Pain,Abnormal gait,Decreased activity tolerance,Decreased range of motion,Decreased strength,Increased edema  Visit Diagnosis: Chronic pain of right knee - Plan: PT plan of care cert/re-cert  Localized edema - Plan: PT plan of care cert/re-cert  Stiffness of right knee, not elsewhere classified - Plan: PT plan of care cert/re-cert  Muscle weakness (generalized) - Plan: PT plan of care cert/re-cert     Problem List Patient Active Problem List   Diagnosis Date Noted  . Obese 03/15/2021  . Osteoarthritis of right knee 03/14/2021  . Status post total right knee replacement 03/14/2021  . Pre-op evaluation 03/09/2021  . Precordial pain 03/09/2021  . Spinal stenosis of lumbar region 10/28/2017  . DM (diabetes mellitus) type 2, uncontrolled, with ketoacidosis ([place]) 05/03/2014  . HLD (hyperlipidemia) 05/03/2014  . BPH (benign prostatic hyperplasia)   . Need for prophylactic vaccination and inoculation against influenza 10/16/2013  . Other and unspecified hyperlipidemia 03/14/2013  . Essential hypertension 03/14/2013  . Type II or unspecified type diabetes mellitus with unspecified complication, uncontrolled 03/14/2013  . Metabolic syndrome 27/06/2375  . Osteoarthritis of  both knees 03/14/2013  . GERD (gastroesophageal reflux disease) 03/14/2013  . ELBOW PAIN 07/13/2008    Lasalle Abee, [place] MPT 03/16/2021, 3:25 PM  [facility] [address] [place], [place], [address] Phone: [phone]   Fax:  [phone]  Name: Michael Tapia MRN: [id number] Date of Birth: 03-Feb-1951

## 2021-03-17 ENCOUNTER — Ambulatory Visit: Payer: Medicare Other | Admitting: Physical Therapy

## 2021-03-17 ENCOUNTER — Other Ambulatory Visit: Payer: Self-pay

## 2021-03-17 ENCOUNTER — Encounter: Payer: Self-pay | Admitting: Physical Therapy

## 2021-03-17 DIAGNOSIS — R600 Localized edema: Secondary | ICD-10-CM | POA: Diagnosis not present

## 2021-03-17 DIAGNOSIS — M6281 Muscle weakness (generalized): Secondary | ICD-10-CM | POA: Diagnosis not present

## 2021-03-17 DIAGNOSIS — M25561 Pain in right knee: Secondary | ICD-10-CM | POA: Diagnosis not present

## 2021-03-17 DIAGNOSIS — G8929 Other chronic pain: Secondary | ICD-10-CM

## 2021-03-17 DIAGNOSIS — M25661 Stiffness of right knee, not elsewhere classified: Secondary | ICD-10-CM

## 2021-03-17 NOTE — Therapy (Signed)
[facility] [place], [place], [address] Phone: [phone]   Fax:  [phone]  Physical Therapy Treatment  Patient Details  Name: Michael Tapia MRN: [id number] Date of Birth: [birth year] Referring Provider (PT): Paralee Cancel MD   Encounter Date: 03/17/2021   PT End of Session - 03/17/21 1113    Visit Number 2    Number of Visits 12    Date for PT Re-Evaluation 04/13/21    Authorization Type FOTO AT LEAST EVERY 5TH VISIT.  PROGRESS NOTE AT 10TH VISIT.  KX MODIFIER AFTER 15 VISITS.    PT Start Time 1030    PT Stop Time 1119    PT Time Calculation (min) 49 min    Equipment Utilized During Treatment Other (comment)   rolling walker   Activity Tolerance Patient tolerated treatment well    Behavior During Therapy WFL for tasks assessed/performed           Past Medical History:  Diagnosis Date  . Arthritis    knees  . BPH (benign prostatic hyperplasia)   . Diabetes mellitus without complication ([place])    Type II  . GERD (gastroesophageal reflux disease)   . History of hiatal hernia   . Hyperlipidemia   . Hypertension   . Lumbar stenosis with neurogenic claudication   . Metabolic syndrome     Past Surgical History:  Procedure Laterality Date  . BACK SURGERY    . EYE SURGERY     Bilateral Cataracts  . LUMBAR LAMINECTOMY/DECOMPRESSION MICRODISCECTOMY Bilateral 10/28/2017   Procedure: Bilateral L3-4 L4-5 L5-S1 Laminotomy/Foraminotomy;  Surgeon: Kristeen Miss, MD;  Location: [facility];  Service: Neurosurgery;  Laterality: Bilateral;  Bilateral L3-4 L4-5 L5-S1 Laminotomy/Foraminotomy  . MANDIBLE FRACTURE SURGERY    . MENISCUS REPAIR     Left  . TONSILLECTOMY    . TOTAL KNEE ARTHROPLASTY Right 03/14/2021   Procedure: TOTAL KNEE ARTHROPLASTY;  Surgeon: Paralee Cancel, MD;  Location: [facility];  Service: Orthopedics;  Laterality: Right;  70 mins    There were no vitals filed for this visit.   Subjective Assessment -  03/17/21 1112    Subjective COVID-19 screening performed upon arrival. Patient arrives doing well but most difficulties with knee extension.    Pertinent History Left knee surgery, back surgery, OA, OA, h/o hiatal hernia, DM, HTN, bee sting allergy, BPH.    How long can you walk comfortably? Around home.    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    Currently in Pain? No/denies   no pain at rest; "hurts with motion"             [facility] PT Assessment - 03/17/21 0001      Assessment   Medical Diagnosis Right total knee replacement.    Referring Provider (PT) Paralee Cancel MD      Precautions   Precaution Comments No ultrasound.  Bee sting allergy.                         [facility] Adult PT Treatment/Exercise - 03/17/21 0001      Knee/Hip Exercises: Aerobic   Recumbent Bike level 1 x5 mins seat 7    Nustep Level 1 x 10 minutes seat 11 to 8      Knee/Hip Exercises: Prone   Prone Knee Hang 4 minutes      Modalities   Modalities Vasopneumatic      Vasopneumatic   Number Minutes Vasopneumatic  15 minutes    Vasopnuematic Location  Knee    Vasopneumatic Pressure Medium    Vasopneumatic Temperature  34      Manual Therapy   Manual Therapy Passive ROM    Passive ROM PROM into knee extension with gentle hold to improve ROM                    PT Short Term Goals - 03/16/21 1519      PT SHORT TERM GOAL #1   Title ind with an initial HEP.    Time 2    Period Weeks    Status New      PT SHORT TERM GOAL #2   Title Full active right knee extension.    Time 2    Period Weeks    Status New             PT Long Term Goals - 03/16/21 1520      PT LONG TERM GOAL #1   Title Independent with an advanced HEP.    Time 4    Period Weeks    Status New      PT LONG TERM GOAL #2   Title Active right knee flexion to 125 degrees so the patient can perform functional tasks and do so with pain not > 2-3/10.    Time 4    Period Weeks     Status New      PT LONG TERM GOAL #3   Title Increase right knee strength to 5/5 to provide good stability for accomplishment of functional activities.    Time 6    Period Weeks    Status New      PT LONG TERM GOAL #4   Title Perform a reciprocating stair gait with one railing with pain not > 2-3/10.    Time 4    Period Weeks    Status New      PT LONG TERM GOAL #5   Title Walk a community distance without assistive device.    Time 4    Period Weeks    Status New                 Plan - 03/17/21 1114    Clinical Impression Statement Patient arrives to physical therapy with no reports of pain at rest. Patient guided through TEs with excellent form and technique. Patient was able to perform full revolutions on the recumbent bike at seat 7. No adverse affects upon removal of modalities.    Personal Factors and Comorbidities Comorbidity 1;Comorbidity 2;Other    Comorbidities Left knee surgery, back surgery, OA, OA, h/o hiatal hernia, DM, HTN, bee sting allergy, BPH.    Examination-Activity Limitations Other;Locomotion Level    Examination-Participation Restrictions Other    Stability/Clinical Decision Making Stable/Uncomplicated    Clinical Decision Making Low    Rehab Potential Excellent    PT Frequency 3x / week    PT Duration 4 weeks    PT Treatment/Interventions ADLs/Self Care Home Management;Cryotherapy;Electrical Stimulation;Gait training;Stair training;Functional mobility training;Therapeutic activities;Therapeutic exercise;Neuromuscular re-education;Manual techniques;Patient/family education;Passive range of motion;Vasopneumatic Device    PT Next Visit Plan FOTO (already loaded)....Nustep and then progress to recumbent bike, VMS ro right quads during SAQ's.  PROM.  PRE's.  Vasopneumatic.    Consulted and Agree with Plan of Care Patient           Patient will benefit from skilled therapeutic intervention in order to improve the following deficits and impairments:  Pain,Abnormal gait,Decreased activity tolerance,Decreased range of motion,Decreased strength,Increased edema  Visit Diagnosis: Chronic pain of right knee  Localized edema  Stiffness of right knee, not elsewhere classified  Muscle weakness (generalized)     Problem List Patient Active Problem List   Diagnosis Date Noted  . Obese 03/15/2021  . Osteoarthritis of right knee 03/14/2021  . Status post total right knee replacement 03/14/2021  . Pre-op evaluation 03/09/2021  . Precordial pain 03/09/2021  . Spinal stenosis of lumbar region 10/28/2017  . DM (diabetes mellitus) type 2, uncontrolled, with ketoacidosis ([place]) 05/03/2014  . HLD (hyperlipidemia) 05/03/2014  . BPH (benign prostatic hyperplasia)   . Need for prophylactic vaccination and inoculation against influenza 10/16/2013  . Other and unspecified hyperlipidemia 03/14/2013  . Essential hypertension 03/14/2013  . Type II or unspecified type diabetes mellitus with unspecified complication, uncontrolled 03/14/2013  . Metabolic syndrome 18/86/7737  . Osteoarthritis of both knees 03/14/2013  . GERD (gastroesophageal reflux disease) 03/14/2013  . ELBOW PAIN 07/13/2008    Gabriela Eves, PT, DPT 03/17/2021, 12:26 PM  [facility] [address] [place], [place], [address] Phone: [phone]   Fax:  [phone]  Name: Michaiah Holsopple MRN: [id number] Date of Birth: 1951/12/01

## 2021-03-20 ENCOUNTER — Other Ambulatory Visit: Payer: Self-pay

## 2021-03-20 ENCOUNTER — Encounter: Payer: Self-pay | Admitting: Physical Therapy

## 2021-03-20 ENCOUNTER — Ambulatory Visit: Payer: Medicare Other | Admitting: Physical Therapy

## 2021-03-20 DIAGNOSIS — R600 Localized edema: Secondary | ICD-10-CM | POA: Diagnosis not present

## 2021-03-20 DIAGNOSIS — M25561 Pain in right knee: Secondary | ICD-10-CM | POA: Diagnosis not present

## 2021-03-20 DIAGNOSIS — M25661 Stiffness of right knee, not elsewhere classified: Secondary | ICD-10-CM | POA: Diagnosis not present

## 2021-03-20 DIAGNOSIS — G8929 Other chronic pain: Secondary | ICD-10-CM | POA: Diagnosis not present

## 2021-03-20 DIAGNOSIS — M6281 Muscle weakness (generalized): Secondary | ICD-10-CM

## 2021-03-20 NOTE — Therapy (Signed)
[facility] [place], [place], [address] Phone: [phone]   Fax:  [phone]  Physical Therapy Treatment  Patient Details  Name: Michael [place] MRN: [id number] Date of Birth: August 21, 1951 Referring Provider (PT): Paralee Cancel MD   Encounter Date: 03/20/2021   PT End of Session - 03/20/21 1354    Visit Number 3    Number of Visits 12    Date for PT Re-Evaluation 04/13/21    Authorization Type FOTO AT LEAST EVERY 5TH VISIT.  PROGRESS NOTE AT 10TH VISIT.  KX MODIFIER AFTER 15 VISITS.    PT Start Time 1345    PT Stop Time 1433    PT Time Calculation (min) 48 min    Equipment Utilized During Treatment Other (comment)   rolling walker   Activity Tolerance Patient tolerated treatment well    Behavior During Therapy WFL for tasks assessed/performed           Past Medical History:  Diagnosis Date  . Arthritis    knees  . BPH (benign prostatic hyperplasia)   . Diabetes mellitus without complication ([place])    Type II  . GERD (gastroesophageal reflux disease)   . History of hiatal hernia   . Hyperlipidemia   . Hypertension   . Lumbar stenosis with neurogenic claudication   . Metabolic syndrome     Past Surgical History:  Procedure Laterality Date  . BACK SURGERY    . EYE SURGERY     Bilateral Cataracts  . LUMBAR LAMINECTOMY/DECOMPRESSION MICRODISCECTOMY Bilateral 10/28/2017   Procedure: Bilateral L3-4 L4-5 L5-S1 Laminotomy/Foraminotomy;  Surgeon: Kristeen Miss, MD;  Location: [facility];  Service: Neurosurgery;  Laterality: Bilateral;  Bilateral L3-4 L4-5 L5-S1 Laminotomy/Foraminotomy  . MANDIBLE FRACTURE SURGERY    . MENISCUS REPAIR     Left  . TONSILLECTOMY    . TOTAL KNEE ARTHROPLASTY Right 03/14/2021   Procedure: TOTAL KNEE ARTHROPLASTY;  Surgeon: Paralee Cancel, MD;  Location[facility];  Service: Orthopedics;  Laterality: Right;  70 mins    There were no vitals filed for this visit.   Subjective Assessment -  03/20/21 1403    Subjective COVID-19 screening performed upon arrival. Patient arrives with no pain currently but stated he may have overdone it over the weekend as he was having a little more pain.    Pertinent History Left knee surgery, back surgery, OA, OA, h/o hiatal hernia, DM, HTN, bee sting allergy, BPH.    How long can you walk comfortably? Around home.    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    Currently in Pain? No/denies              [facility] PT Assessment - 03/20/21 0001      Assessment   Medical Diagnosis Right total knee replacement.    Referring Provider (PT) Paralee Cancel MD    Next MD Visit 03/29/2021      Precautions   Precaution Comments No ultrasound.  Bee sting allergy.      Observation/Other Assessments   Focus on Therapeutic Outcomes (FOTO)  51% limitation                         [facility] Adult PT Treatment/Exercise - 03/20/21 0001      Exercises   Exercises Knee/Hip      Knee/Hip Exercises: Stretches   Passive Hamstring Stretch Both;10 seconds;Other (comment)   x10 10" hold     Knee/Hip Exercises:  Aerobic   Recumbent Bike level 1 x5 mins seat 6    Nustep Level 1 x 10 minutes seat 10 to 8      Knee/Hip Exercises: Standing   Rocker Board 3 minutes      Knee/Hip Exercises: Prone   Prone Knee Hang 4 minutes      Modalities   Modalities Vasopneumatic      Vasopneumatic   Number Minutes Vasopneumatic  10 minutes    Vasopnuematic Location  Knee    Vasopneumatic Pressure Medium    Vasopneumatic Temperature  34      Manual Therapy   Manual Therapy Passive ROM    Passive ROM PROM into knee extension with gentle hold to improve ROM                    PT Short Term Goals - 03/16/21 1519      PT SHORT TERM GOAL #1   Title ind with an initial HEP.    Time 2    Period Weeks    Status New      PT SHORT TERM GOAL #2   Title Full active right knee extension.    Time 2    Period Weeks    Status New              PT Long Term Goals - 03/16/21 1520      PT LONG TERM GOAL #1   Title Independent with an advanced HEP.    Time 4    Period Weeks    Status New      PT LONG TERM GOAL #2   Title Active right knee flexion to 125 degrees so the patient can perform functional tasks and do so with pain not > 2-3/10.    Time 4    Period Weeks    Status New      PT LONG TERM GOAL #3   Title Increase right knee strength to 5/5 to provide good stability for accomplishment of functional activities.    Time 6    Period Weeks    Status New      PT LONG TERM GOAL #4   Title Perform a reciprocating stair gait with one railing with pain not > 2-3/10.    Time 4    Period Weeks    Status New      PT LONG TERM GOAL #5   Title Walk a community distance without assistive device.    Time 4    Period Weeks    Status New                 Plan - 03/20/21 1403    Clinical Impression Statement Patient responded well to therapy session though with reports of some discomfort with standing stretching. Patient provided with intermittent tactile cuing for technique with strong carryover for remaining reps. Patient responded well with PROM into knee extension. No adverse affects upon removal of modalities.    Personal Factors and Comorbidities Comorbidity 1;Comorbidity 2;Other    Comorbidities Left knee surgery, back surgery, OA, OA, h/o hiatal hernia, DM, HTN, bee sting allergy, BPH.    Examination-Activity Limitations Other;Locomotion Level    Examination-Participation Restrictions Other    Stability/Clinical Decision Making Stable/Uncomplicated    Clinical Decision Making Low    Rehab Potential Excellent    PT Frequency 3x / week    PT Duration 4 weeks    PT Treatment/Interventions ADLs/Self Care Home Management;Cryotherapy;Electrical Stimulation;Gait training;Stair training;Functional mobility  training;Therapeutic activities;Therapeutic exercise;Neuromuscular re-education;Manual  techniques;Patient/family education;Passive range of motion;Vasopneumatic Device    PT Next Visit Plan Nustep and then progress to recumbent bike, VMS ro right quads during SAQ's.  PROM.  PRE's.  Vasopneumatic.    Consulted and Agree with Plan of Care Patient           Patient will benefit from skilled therapeutic intervention in order to improve the following deficits and impairments:  Pain,Abnormal gait,Decreased activity tolerance,Decreased range of motion,Decreased strength,Increased edema  Visit Diagnosis: Chronic pain of right knee  Stiffness of right knee, not elsewhere classified  Localized edema  Muscle weakness (generalized)     Problem List Patient Active Problem List   Diagnosis Date Noted  . Obese 03/15/2021  . Osteoarthritis of right knee 03/14/2021  . Status post total right knee replacement 03/14/2021  . Pre-op evaluation 03/09/2021  . Precordial pain 03/09/2021  . Spinal stenosis of lumbar region 10/28/2017  . DM (diabetes mellitus) type 2, uncontrolled, with ketoacidosis ([place]) 05/03/2014  . HLD (hyperlipidemia) 05/03/2014  . BPH (benign prostatic hyperplasia)   . Need for prophylactic vaccination and inoculation against influenza 10/16/2013  . Other and unspecified hyperlipidemia 03/14/2013  . Essential hypertension 03/14/2013  . Type II or unspecified type diabetes mellitus with unspecified complication, uncontrolled 03/14/2013  . Metabolic syndrome 32/99/2426  . Osteoarthritis of both knees 03/14/2013  . GERD (gastroesophageal reflux disease) 03/14/2013  . ELBOW PAIN 07/13/2008    Gabriela Eves, PT, DPT 03/20/2021, 2:45 PM  [facility] [address] [place], [place], [address] Phone: [phone]   Fax:  [phone]  Name: Michael [place]ia MRN: [id number] Date of Birth: [birth year]

## 2021-03-22 ENCOUNTER — Ambulatory Visit: Payer: Medicare Other | Admitting: Physical Therapy

## 2021-03-22 ENCOUNTER — Encounter: Payer: Self-pay | Admitting: Physical Therapy

## 2021-03-22 ENCOUNTER — Other Ambulatory Visit: Payer: Self-pay

## 2021-03-22 DIAGNOSIS — G8929 Other chronic pain: Secondary | ICD-10-CM | POA: Diagnosis not present

## 2021-03-22 DIAGNOSIS — M25661 Stiffness of right knee, not elsewhere classified: Secondary | ICD-10-CM

## 2021-03-22 DIAGNOSIS — R600 Localized edema: Secondary | ICD-10-CM | POA: Diagnosis not present

## 2021-03-22 DIAGNOSIS — M25561 Pain in right knee: Secondary | ICD-10-CM | POA: Diagnosis not present

## 2021-03-22 DIAGNOSIS — M6281 Muscle weakness (generalized): Secondary | ICD-10-CM | POA: Diagnosis not present

## 2021-03-22 NOTE — Therapy (Signed)
[facility] [place], [place], [address] Phone: [phone]   Fax:  [phone]  Physical Therapy Treatment  Patient Details  Name: Michael [place] MRN: [id number] Date of Birth: 1951-07-08 Referring Provider (PT): Paralee Cancel MD   Encounter Date: 03/22/2021   PT End of Session - 03/22/21 1503    Visit Number 4    Number of Visits 12    Date for PT Re-Evaluation 04/13/21    Authorization Type FOTO AT LEAST EVERY 5TH VISIT.  PROGRESS NOTE AT 10TH VISIT.  KX MODIFIER AFTER 15 VISITS.    PT Start Time 1345    PT Stop Time 1434    PT Time Calculation (min) 49 min    Equipment Utilized During Treatment Other (comment)   rolling walker   Activity Tolerance Patient tolerated treatment well    Behavior During Therapy WFL for tasks assessed/performed           Past Medical History:  Diagnosis Date  . Arthritis    knees  . BPH (benign prostatic hyperplasia)   . Diabetes mellitus without complication ([place])    Type II  . GERD (gastroesophageal reflux disease)   . History of hiatal hernia   . Hyperlipidemia   . Hypertension   . Lumbar stenosis with neurogenic claudication   . Metabolic syndrome     Past Surgical History:  Procedure Laterality Date  . BACK SURGERY    . EYE SURGERY     Bilateral Cataracts  . LUMBAR LAMINECTOMY/DECOMPRESSION MICRODISCECTOMY Bilateral 10/28/2017   Procedure: Bilateral L3-4 L4-5 L5-S1 Laminotomy/Foraminotomy;  Surgeon: Kristeen Miss, MD;  Location: [facility];  Service: Neurosurgery;  Laterality: Bilateral;  Bilateral L3-4 L4-5 L5-S1 Laminotomy/Foraminotomy  . MANDIBLE FRACTURE SURGERY    . MENISCUS REPAIR     Left  . TONSILLECTOMY    . TOTAL KNEE ARTHROPLASTY Right 03/14/2021   Procedure: TOTAL KNEE ARTHROPLASTY;  Surgeon: Paralee Cancel, MD;  [facility];  Service: Orthopedics;  Laterality: Right;  70 mins    There were no vitals filed for this visit.   Subjective Assessment -  03/22/21 1356    Subjective COVID-19 screening performed upon arrival. Patient reports walking around his house without his walker and it cause lots of cramping in his right calf.    Pertinent History Left knee surgery, back surgery, OA, OA, h/o hiatal hernia, DM, HTN, bee sting allergy, BPH.    How long can you walk comfortably? Around home.    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    Currently in Pain? No/denies              [facility] PT Assessment - 03/22/21 0001      Assessment   Medical Diagnosis Right total knee replacement.    Referring Provider (PT) Paralee Cancel MD    Next MD Visit 03/29/2021      Precautions   Precaution Comments No ultrasound.  Bee sting allergy.                         [facility] Adult PT Treatment/Exercise - 03/22/21 0001      Exercises   Exercises Knee/Hip      Knee/Hip Exercises: Aerobic   Recumbent Bike level 1 x5 mins seat 6    Nustep Level 2 x 10 minutes seat 10 to 8      Knee/Hip Exercises: Standing   Heel Raises Both;20 reps    Heel  Raises Limitations toe raises x20    Rocker Board 3 minutes      Knee/Hip Exercises: Supine   Heel Prop for Knee Extension Weight;3 minutes    Heel Prop for Knee Extension Weight (lbs) 3      Modalities   Modalities Vasopneumatic      Vasopneumatic   Number Minutes Vasopneumatic  10 minutes    Vasopnuematic Location  Knee    Vasopneumatic Pressure Medium    Vasopneumatic Temperature  34      Manual Therapy   Manual Therapy Passive ROM    Manual therapy comments patella mobs to imporove knee extension and flexion    Passive ROM PROM into knee extension with gentle hold to improve ROM                    PT Short Term Goals - 03/16/21 1519      PT SHORT TERM GOAL #1   Title ind with an initial HEP.    Time 2    Period Weeks    Status New      PT SHORT TERM GOAL #2   Title Full active right knee extension.    Time 2    Period Weeks    Status New              PT Long Term Goals - 03/16/21 1520      PT LONG TERM GOAL #1   Title Independent with an advanced HEP.    Time 4    Period Weeks    Status New      PT LONG TERM GOAL #2   Title Active right knee flexion to 125 degrees so the patient can perform functional tasks and do so with pain not > 2-3/10.    Time 4    Period Weeks    Status New      PT LONG TERM GOAL #3   Title Increase right knee strength to 5/5 to provide good stability for accomplishment of functional activities.    Time 6    Period Weeks    Status New      PT LONG TERM GOAL #4   Title Perform a reciprocating stair gait with one railing with pain not > 2-3/10.    Time 4    Period Weeks    Status New      PT LONG TERM GOAL #5   Title Walk a community distance without assistive device.    Time 4    Period Weeks    Status New                 Plan - 03/22/21 1409    Clinical Impression Statement Patient responded fairly well to therapy session with no pain in the knee just calf tightness. Patient was able to peform TEs with improved form after verbal cuing. Patient demonstrated significant improvements with volitional contraction and hold during quad sets. Patient and PT discussed slow progression into regular activities to prevent any injuries or set backs to the knee. Patient reported understanding. No adverse affects upon removal of modalities.    Personal Factors and Comorbidities Comorbidity 1;Comorbidity 2;Other    Comorbidities Left knee surgery, back surgery, OA, OA, h/o hiatal hernia, DM, HTN, bee sting allergy, BPH.    Examination-Activity Limitations Other;Locomotion Level    Examination-Participation Restrictions Other    Stability/Clinical Decision Making Stable/Uncomplicated    Clinical Decision Making Low    Rehab Potential Excellent  PT Frequency 3x / week    PT Duration 4 weeks    PT Treatment/Interventions ADLs/Self Care Home Management;Cryotherapy;Electrical  Stimulation;Gait training;Stair training;Functional mobility training;Therapeutic activities;Therapeutic exercise;Neuromuscular re-education;Manual techniques;Patient/family education;Passive range of motion;Vasopneumatic Device    PT Next Visit Plan Nustep and then progress to recumbent bike, VMS ro right quads during SAQ's.  PROM.  PRE's.  Vasopneumatic.    Consulted and Agree with Plan of Care Patient           Patient will benefit from skilled therapeutic intervention in order to improve the following deficits and impairments:  Pain,Abnormal gait,Decreased activity tolerance,Decreased range of motion,Decreased strength,Increased edema  Visit Diagnosis: Chronic pain of right knee  Stiffness of right knee, not elsewhere classified  Localized edema  Muscle weakness (generalized)     Problem List Patient Active Problem List   Diagnosis Date Noted  . Obese 03/15/2021  . Osteoarthritis of right knee 03/14/2021  . Status post total right knee replacement 03/14/2021  . Pre-op evaluation 03/09/2021  . Precordial pain 03/09/2021  . Spinal stenosis of lumbar region 10/28/2017  . DM (diabetes mellitus) type 2, uncontrolled, with ketoacidosis ([place]) 05/03/2014  . HLD (hyperlipidemia) 05/03/2014  . BPH (benign prostatic hyperplasia)   . Need for prophylactic vaccination and inoculation against influenza 10/16/2013  . Other and unspecified hyperlipidemia 03/14/2013  . Essential hypertension 03/14/2013  . Type II or unspecified type diabetes mellitus with unspecified complication, uncontrolled 03/14/2013  . Metabolic syndrome 16/94/5038  . Osteoarthritis of both knees 03/14/2013  . GERD (gastroesophageal reflux disease) 03/14/2013  . ELBOW PAIN 07/13/2008    Gabriela Eves, PT, DPT 03/22/2021, 3:09 PM  [facility] [address] [place], [place], [address] Phone: [phone]   Fax:  [phone]  Name: Michael Tapia MRN:  [id number] Date of Birth: 05/12/1951

## 2021-03-24 ENCOUNTER — Other Ambulatory Visit: Payer: Self-pay

## 2021-03-24 ENCOUNTER — Ambulatory Visit: Payer: Medicare Other | Admitting: *Deleted

## 2021-03-24 DIAGNOSIS — M6281 Muscle weakness (generalized): Secondary | ICD-10-CM

## 2021-03-24 DIAGNOSIS — M25661 Stiffness of right knee, not elsewhere classified: Secondary | ICD-10-CM

## 2021-03-24 DIAGNOSIS — R600 Localized edema: Secondary | ICD-10-CM | POA: Diagnosis not present

## 2021-03-24 DIAGNOSIS — G8929 Other chronic pain: Secondary | ICD-10-CM | POA: Diagnosis not present

## 2021-03-24 DIAGNOSIS — M25561 Pain in right knee: Secondary | ICD-10-CM

## 2021-03-24 NOTE — Therapy (Signed)
[facility] [place], [place], [address] Phone: [phone]   Fax:  [phone]  Physical Therapy Treatment  Patient Details  Name: Michael Tapia MRN: [id number] Date of Birth: January 06, 1951 Referring Provider (PT): Paralee Cancel MD   Encounter Date: 03/24/2021   PT End of Session - 03/24/21 1128    Visit Number 5    Number of Visits 12    Date for PT Re-Evaluation 04/13/21    Authorization Type FOTO AT LEAST EVERY 5TH VISIT.  PROGRESS NOTE AT 10TH VISIT.  KX MODIFIER AFTER 15 VISITS.    PT Start Time 1115    PT Stop Time 1208    PT Time Calculation (min) 53 min           Past Medical History:  Diagnosis Date  . Arthritis    knees  . BPH (benign prostatic hyperplasia)   . Diabetes mellitus without complication ([place])    Type II  . GERD (gastroesophageal reflux disease)   . History of hiatal hernia   . Hyperlipidemia   . Hypertension   . Lumbar stenosis with neurogenic claudication   . Metabolic syndrome     Past Surgical History:  Procedure Laterality Date  . BACK SURGERY    . EYE SURGERY     Bilateral Cataracts  . LUMBAR LAMINECTOMY/DECOMPRESSION MICRODISCECTOMY Bilateral 10/28/2017   Procedure: Bilateral L3-4 L4-5 L5-S1 Laminotomy/Foraminotomy;  Surgeon: Kristeen Miss, MD;  Location: [facility];  Service: Neurosurgery;  Laterality: Bilateral;  Bilateral L3-4 L4-5 L5-S1 Laminotomy/Foraminotomy  . MANDIBLE FRACTURE SURGERY    . MENISCUS REPAIR     Left  . TONSILLECTOMY    . TOTAL KNEE ARTHROPLASTY Right 03/14/2021   Procedure: TOTAL KNEE ARTHROPLASTY;  Surgeon: Paralee Cancel, MD;  Location: [facility];  Service: Orthopedics;  Laterality: Right;  70 mins    There were no vitals filed for this visit.   Subjective Assessment - 03/24/21 1122    Subjective COVID-19 screening performed upon arrival. Doing ok. RT  knee stiffness. Pain at night    Pertinent History Left knee surgery, back surgery, OA, OA, h/o hiatal  hernia, DM, HTN, bee sting allergy, BPH.    How long can you walk comfortably? Around home.    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                             [facility] Adult PT Treatment/Exercise - 03/24/21 0001      Exercises   Exercises Knee/Hip      Knee/Hip Exercises: Aerobic   Recumbent Bike level 1 x5 mins seat 6    Nustep Level 2 x 10 minutes seat 10 to 8      Knee/Hip Exercises: Standing   Heel Raises Both;20 reps    Heel Raises Limitations toe raises x20    Rocker Board 3 minutes      Modalities   Modalities Vasopneumatic      Vasopneumatic   Number Minutes Vasopneumatic  10 minutes    Vasopnuematic Location  Knee    Vasopneumatic Pressure Medium    Vasopneumatic Temperature  34      Manual Therapy   Manual Therapy Passive ROM    Manual therapy comments patella mobs to imporove knee extension and flexion    Passive ROM PROM into knee extension with gentle hold to improve ROM  PT Short Term Goals - 03/16/21 1519      PT SHORT TERM GOAL #1   Title ind with an initial HEP.    Time 2    Period Weeks    Status New      PT SHORT TERM GOAL #2   Title Full active right knee extension.    Time 2    Period Weeks    Status New             PT Long Term Goals - 03/16/21 1520      PT LONG TERM GOAL #1   Title Independent with an advanced HEP.    Time 4    Period Weeks    Status New      PT LONG TERM GOAL #2   Title Active right knee flexion to 125 degrees so the patient can perform functional tasks and do so with pain not > 2-3/10.    Time 4    Period Weeks    Status New      PT LONG TERM GOAL #3   Title Increase right knee strength to 5/5 to provide good stability for accomplishment of functional activities.    Time 6    Period Weeks    Status New      PT LONG TERM GOAL #4   Title Perform a reciprocating stair gait with one railing with pain not > 2-3/10.    Time 4     Period Weeks    Status New      PT LONG TERM GOAL #5   Title Walk a community distance without assistive device.    Time 4    Period Weeks    Status New                 Plan - 03/24/21 1208    Clinical Impression Statement Pt arrived today doing very well with low pain levels RT knee.  Rx focused on ROM as well as strengthening for RT LE. Pt was able progress with exs today and ROM is  progressing. To MD next week. Vaso tolerated well.    Personal Factors and Comorbidities Comorbidity 1;Comorbidity 2;Other    Comorbidities Left knee surgery, back surgery, OA, OA, h/o hiatal hernia, DM, HTN, bee sting allergy, BPH.    Examination-Activity Limitations Other;Locomotion Level    Examination-Participation Restrictions Other    Stability/Clinical Decision Making Stable/Uncomplicated    Rehab Potential Excellent    PT Frequency 3x / week    PT Duration 4 weeks    PT Treatment/Interventions ADLs/Self Care Home Management;Cryotherapy;Electrical Stimulation;Gait training;Stair training;Functional mobility training;Therapeutic activities;Therapeutic exercise;Neuromuscular re-education;Manual techniques;Patient/family education;Passive range of motion;Vasopneumatic Device    PT Next Visit Plan Nustep and then progress to recumbent bike, VMS ro right quads during SAQ's.  PROM.  PRE's.  Vasopneumatic.    Consulted and Agree with Plan of Care Patient           Patient will benefit from skilled therapeutic intervention in order to improve the following deficits and impairments:  Pain,Abnormal gait,Decreased activity tolerance,Decreased range of motion,Decreased strength,Increased edema  Visit Diagnosis: Chronic pain of right knee  Stiffness of right knee, not elsewhere classified  Localized edema  Muscle weakness (generalized)     Problem List Patient Active Problem List   Diagnosis Date Noted  . Obese 03/15/2021  . Osteoarthritis of right knee 03/14/2021  . Status post total  right knee replacement 03/14/2021  . Pre-op evaluation 03/09/2021  . Precordial pain 03/09/2021  .  Spinal stenosis of lumbar region 10/28/2017  . DM (diabetes mellitus) type 2, uncontrolled, with ketoacidosis ([place]) 05/03/2014  . HLD (hyperlipidemia) 05/03/2014  . BPH (benign prostatic hyperplasia)   . Need for prophylactic vaccination and inoculation against influenza 10/16/2013  . Other and unspecified hyperlipidemia 03/14/2013  . Essential hypertension 03/14/2013  . Type II or unspecified type diabetes mellitus with unspecified complication, uncontrolled 03/14/2013  . Metabolic syndrome 99/69/2493  . Osteoarthritis of both knees 03/14/2013  . GERD (gastroesophageal reflux disease) 03/14/2013  . ELBOW PAIN 07/13/2008    Lenford Beddow,CHRIS, PTA 03/24/2021, 12:23 PM  [facility] [address] [place], [place], [address] Phone: [phone]   Fax:  [phone]  Name: Michael Tapia MRN: [id number] Date of Birth: 1951-11-29

## 2021-03-27 ENCOUNTER — Other Ambulatory Visit: Payer: Self-pay

## 2021-03-27 ENCOUNTER — Ambulatory Visit: Payer: Medicare Other | Admitting: Physical Therapy

## 2021-03-27 DIAGNOSIS — G8929 Other chronic pain: Secondary | ICD-10-CM | POA: Diagnosis not present

## 2021-03-27 DIAGNOSIS — M6281 Muscle weakness (generalized): Secondary | ICD-10-CM

## 2021-03-27 DIAGNOSIS — M25661 Stiffness of right knee, not elsewhere classified: Secondary | ICD-10-CM

## 2021-03-27 DIAGNOSIS — R600 Localized edema: Secondary | ICD-10-CM | POA: Diagnosis not present

## 2021-03-27 DIAGNOSIS — M25561 Pain in right knee: Secondary | ICD-10-CM | POA: Diagnosis not present

## 2021-03-27 NOTE — Therapy (Signed)
[facility] [place], [place], [address] Phone: [phone]   Fax:  [phone]  Physical Therapy Treatment  Patient Details  Name: Michael Tapia MRN: [id number] Date of Birth: March 16, 1951 Referring Provider (PT): Paralee Cancel MD   Encounter Date: 03/27/2021   PT End of Session - 03/27/21 1430    Visit Number 6    Number of Visits 12    Date for PT Re-Evaluation 04/13/21    Authorization Type FOTO AT LEAST EVERY 5TH VISIT.  PROGRESS NOTE AT 10TH VISIT.  KX MODIFIER AFTER 15 VISITS.    PT Start Time 0226    PT Stop Time 0312    PT Time Calculation (min) 46 min    Activity Tolerance Patient tolerated treatment well    Behavior During Therapy [facility] for tasks assessed/performed           Past Medical History:  Diagnosis Date  . Arthritis    knees  . BPH (benign prostatic hyperplasia)   . Diabetes mellitus without complication ([place])    Type II  . GERD (gastroesophageal reflux disease)   . History of hiatal hernia   . Hyperlipidemia   . Hypertension   . Lumbar stenosis with neurogenic claudication   . Metabolic syndrome     Past Surgical History:  Procedure Laterality Date  . BACK SURGERY    . EYE SURGERY     Bilateral Cataracts  . LUMBAR LAMINECTOMY/DECOMPRESSION MICRODISCECTOMY Bilateral 10/28/2017   Procedure: Bilateral L3-4 L4-5 L5-S1 Laminotomy/Foraminotomy;  Surgeon: Kristeen Miss, MD;  Location: [facility];  Service: Neurosurgery;  Laterality: Bilateral;  Bilateral L3-4 L4-5 L5-S1 Laminotomy/Foraminotomy  . MANDIBLE FRACTURE SURGERY    . MENISCUS REPAIR     Left  . TONSILLECTOMY    . TOTAL KNEE ARTHROPLASTY Right 03/14/2021   Procedure: TOTAL KNEE ARTHROPLASTY;  Surgeon: Paralee Cancel, MD;  Location: [facility];  Service: Orthopedics;  Laterality: Right;  70 mins    There were no vitals filed for this visit.   Subjective Assessment - 03/27/21 1429    Subjective COVID-19 screening performed upon arrival.  Patient reported doing well last treatment. Some pain depending on movement. No pain upon arrival.    Pertinent History Left knee surgery, back surgery, OA, OA, h/o hiatal hernia, DM, HTN, bee sting allergy, BPH.    How long can you walk comfortably? Around home.    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              [facility] PT Assessment - 03/27/21 0001      ROM / Strength   AROM / PROM / Strength AROM;PROM      AROM   AROM Assessment Site Knee    Right/Left Knee Right    Right Knee Extension -15    Right Knee Flexion 115      PROM   PROM Assessment Site Knee    Right/Left Knee Right    Right Knee Extension -11    Right Knee Flexion 120                         [facility] Adult PT Treatment/Exercise - 03/27/21 0001      Knee/Hip Exercises: Aerobic   Recumbent Bike x1min    Nustep L3 x13min      Knee/Hip Exercises: Standing   Rocker Board --      Vasopneumatic   Number Minutes Vasopneumatic  10 minutes  Vasopnuematic Location  Knee    Vasopneumatic Pressure Medium    Vasopneumatic Temperature  34      Manual Therapy   Manual Therapy Passive ROM    Manual therapy comments patella mobs    Passive ROM PROM into knee extension with gentle hold to improve ROM                    PT Short Term Goals - 03/27/21 1431      PT SHORT TERM GOAL #1   Title ind with an initial HEP.    Baseline 03/27/21    Time 2    Period Weeks    Status Achieved      PT SHORT TERM GOAL #2   Title Full active right knee extension.    Baseline AROM -15 degrees 03/27/21    Time 2    Period Weeks    Status On-going             PT Long Term Goals - 03/27/21 1431      PT LONG TERM GOAL #1   Title Independent with an advanced HEP.    Time 4    Period Weeks    Status On-going      PT LONG TERM GOAL #2   Title Active right knee flexion to 125 degrees so the patient can perform functional tasks and do so with pain not > 2-3/10.     Baseline AROM 115 degrees 03/27/21    Time 4    Period Weeks    Status On-going      PT LONG TERM GOAL #3   Title Increase right knee strength to 5/5 to provide good stability for accomplishment of functional activities.    Time 6    Period Weeks    Status On-going      PT LONG TERM GOAL #4   Title Perform a reciprocating stair gait with one railing with pain not > 2-3/10.    Time 4    Period Weeks    Status On-going      PT LONG TERM GOAL #5   Title Walk a community distance without assistive device.    Time 4    Period Weeks    Status On-going                 Plan - 03/27/21 1506    Clinical Impression Statement Patient tolerated treatment well today. Today focused on ROM esp for ext due to lack of mobility with ext. Patient doing well with ADL's and little pain with rest. Patient improved overall ROM for flexion with ongoing limitations with ext. Goals progressing.    Personal Factors and Comorbidities Comorbidity 1;Comorbidity 2;Other    Comorbidities Left knee surgery, back surgery, OA, OA, h/o hiatal hernia, DM, HTN, bee sting allergy, BPH.    Examination-Activity Limitations Other;Locomotion Level    Examination-Participation Restrictions Other    Stability/Clinical Decision Making Stable/Uncomplicated    Rehab Potential Excellent    PT Frequency 3x / week    PT Duration 4 weeks    PT Treatment/Interventions ADLs/Self Care Home Management;Cryotherapy;Electrical Stimulation;Gait training;Stair training;Functional mobility training;Therapeutic activities;Therapeutic exercise;Neuromuscular re-education;Manual techniques;Patient/family education;Passive range of motion;Vasopneumatic Device    PT Next Visit Plan cont with POC for ROM focus on ext and MD note to dr. Alvan Dame next visit    Consulted and Agree with Plan of Care Patient           Patient will benefit from skilled therapeutic  intervention in order to improve the following deficits and impairments:   Pain,Abnormal gait,Decreased activity tolerance,Decreased range of motion,Decreased strength,Increased edema  Visit Diagnosis: Chronic pain of right knee  Stiffness of right knee, not elsewhere classified  Localized edema  Muscle weakness (generalized)     Problem List Patient Active Problem List   Diagnosis Date Noted  . Obese 03/15/2021  . Osteoarthritis of right knee 03/14/2021  . Status post total right knee replacement 03/14/2021  . Pre-op evaluation 03/09/2021  . Precordial pain 03/09/2021  . Spinal stenosis of lumbar region 10/28/2017  . DM (diabetes mellitus) type 2, uncontrolled, with ketoacidosis ([place]) 05/03/2014  . HLD (hyperlipidemia) 05/03/2014  . BPH (benign prostatic hyperplasia)   . Need for prophylactic vaccination and inoculation against influenza 10/16/2013  . Other and unspecified hyperlipidemia 03/14/2013  . Essential hypertension 03/14/2013  . Type II or unspecified type diabetes mellitus with unspecified complication, uncontrolled 03/14/2013  . Metabolic syndrome 08/02/2335  . Osteoarthritis of both knees 03/14/2013  . GERD (gastroesophageal reflux disease) 03/14/2013  . ELBOW PAIN 07/13/2008    Phillips Climes, PTA 03/27/2021, 3:13 PM  [facility] [place], [place], [address] Phone: [phone]   Fax:  [phone]  Name: Michael Tapia MRN: [id number] Date of Birth: 10-09-1951

## 2021-03-29 ENCOUNTER — Ambulatory Visit: Payer: Medicare Other | Admitting: Physical Therapy

## 2021-03-29 ENCOUNTER — Encounter: Payer: Self-pay | Admitting: Physical Therapy

## 2021-03-29 ENCOUNTER — Other Ambulatory Visit: Payer: Self-pay

## 2021-03-29 DIAGNOSIS — G8929 Other chronic pain: Secondary | ICD-10-CM | POA: Diagnosis not present

## 2021-03-29 DIAGNOSIS — M25661 Stiffness of right knee, not elsewhere classified: Secondary | ICD-10-CM

## 2021-03-29 DIAGNOSIS — M6281 Muscle weakness (generalized): Secondary | ICD-10-CM

## 2021-03-29 DIAGNOSIS — R600 Localized edema: Secondary | ICD-10-CM

## 2021-03-29 DIAGNOSIS — M25561 Pain in right knee: Secondary | ICD-10-CM | POA: Diagnosis not present

## 2021-03-29 NOTE — Therapy (Signed)
[facility] [place], [place], [address] Phone: [phone]   Fax:  [phone]  Physical Therapy Treatment  Patient Details  Name: Michael Tapia MRN: [id number] Date of Birth: [birth year] Referring Provider (PT): Paralee Cancel MD   Encounter Date: 03/29/2021   PT End of Session - 03/29/21 1124    Visit Number 7    Number of Visits 12    Date for PT Re-Evaluation 04/13/21    Authorization Type FOTO AT LEAST EVERY 5TH VISIT.  PROGRESS NOTE AT 10TH VISIT.  KX MODIFIER AFTER 15 VISITS.    PT Start Time 1114    PT Stop Time 1159    PT Time Calculation (min) 45 min    Activity Tolerance Patient tolerated treatment well    Behavior During Therapy [facility] for tasks assessed/performed           Past Medical History:  Diagnosis Date  . Arthritis    knees  . BPH (benign prostatic hyperplasia)   . Diabetes mellitus without complication ([place])    Type II  . GERD (gastroesophageal reflux disease)   . History of hiatal hernia   . Hyperlipidemia   . Hypertension   . Lumbar stenosis with neurogenic claudication   . Metabolic syndrome     Past Surgical History:  Procedure Laterality Date  . BACK SURGERY    . EYE SURGERY     Bilateral Cataracts  . LUMBAR LAMINECTOMY/DECOMPRESSION MICRODISCECTOMY Bilateral 10/28/2017   Procedure: Bilateral L3-4 L4-5 L5-S1 Laminotomy/Foraminotomy;  Surgeon: Kristeen Miss, MD;  Location: [facility];  Service: Neurosurgery;  Laterality: Bilateral;  Bilateral L3-4 L4-5 L5-S1 Laminotomy/Foraminotomy  . MANDIBLE FRACTURE SURGERY    . MENISCUS REPAIR     Left  . TONSILLECTOMY    . TOTAL KNEE ARTHROPLASTY Right 03/14/2021   Procedure: TOTAL KNEE ARTHROPLASTY;  Surgeon: Paralee Cancel, MD;  Location[facility];  Service: Orthopedics;  Laterality: Right;  70 mins    There were no vitals filed for this visit.   Subjective Assessment - 03/29/21 1122    Subjective COVID-19 screening performed upon arrival.  Patient has been self massaging which has helped with pain. No complaints.    Pertinent History Left knee surgery, back surgery, OA, OA, h/o hiatal hernia, DM, HTN, bee sting allergy, BPH.    How long can you walk comfortably? Around home.    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    Currently in Pain? No/denies              [facility] PT Assessment - 03/29/21 0001      Assessment   Medical Diagnosis Right total knee replacement.    Referring Provider (PT) Paralee Cancel MD    Onset Date/Surgical Date 03/14/21    Next MD Visit 03/29/2021      Precautions   Precaution Comments No ultrasound.  Bee sting allergy.      Restrictions   Weight Bearing Restrictions No      ROM / Strength   AROM / PROM / Strength AROM      AROM   Overall AROM  Within functional limits for tasks performed    AROM Assessment Site Knee    Right/Left Knee Right    Right Knee Extension 13    Right Knee Flexion 129                         [facility] Adult PT Treatment/Exercise - 03/29/21  0001      Knee/Hip Exercises: Aerobic   Recumbent Bike L1, seat 8 x5 min    Nustep L3, seat adjusted for ROM x10 min      Knee/Hip Exercises: Supine   Heel Prop for Knee Extension 4 minutes;Weight    Heel Prop for Knee Extension Weight (lbs) 4      Modalities   Modalities Vasopneumatic      Vasopneumatic   Number Minutes Vasopneumatic  15 minutes    Vasopnuematic Location  Knee    Vasopneumatic Pressure Medium    Vasopneumatic Temperature  34      Manual Therapy   Manual Therapy Passive ROM;Soft tissue mobilization    Soft tissue mobilization STW to R HS to reduce tone    Passive ROM PROM of R knee into extension with holds at end range; passive R HS stretch 3x30 sec                    PT Short Term Goals - 03/27/21 1431      PT SHORT TERM GOAL #1   Title ind with an initial HEP.    Baseline 03/27/21    Time 2    Period Weeks    Status Achieved      PT SHORT  TERM GOAL #2   Title Full active right knee extension.    Baseline AROM -15 degrees 03/27/21    Time 2    Period Weeks    Status On-going             PT Long Term Goals - 03/29/21 1202      PT LONG TERM GOAL #1   Title Independent with an advanced HEP.    Time 4    Period Weeks    Status On-going      PT LONG TERM GOAL #2   Title Active right knee flexion to 125 degrees so the patient can perform functional tasks and do so with pain not > 2-3/10.    Baseline AROM 115 degrees 03/27/21    Time 4    Period Weeks    Status Achieved      PT LONG TERM GOAL #3   Title Increase right knee strength to 5/5 to provide good stability for accomplishment of functional activities.    Time 6    Period Weeks    Status On-going      PT LONG TERM GOAL #4   Title Perform a reciprocating stair gait with one railing with pain not > 2-3/10.    Time 4    Period Weeks    Status On-going      PT LONG TERM GOAL #5   Title Walk a community distance without assistive device.    Time 4    Period Weeks    Status On-going                 Plan - 03/29/21 1156    Clinical Impression Statement Patient presented in clinic with no pain. Patient very motivated to return to [facility] and return to fishing. Patient very guarded into extension with stretching and PROM. Prior to stretching/massage R knee extension measured as 19 deg from neutral. Following massage/PROM/stretching, R knee extension progressed to 13 deg from neutral. Normal vasopnuematic response noted following removal of the modality. Patient no longer using AD.    Personal Factors and Comorbidities Comorbidity 1;Comorbidity 2;Other    Comorbidities Left knee surgery, back surgery, OA, OA, h/o hiatal hernia,  DM, HTN, bee sting allergy, BPH.    Examination-Activity Limitations Other;Locomotion Level    Examination-Participation Restrictions Other    Stability/Clinical Decision Making Stable/Uncomplicated    Rehab Potential Excellent     PT Frequency 3x / week    PT Duration 4 weeks    PT Treatment/Interventions ADLs/Self Care Home Management;Cryotherapy;Electrical Stimulation;Gait training;Stair training;Functional mobility training;Therapeutic activities;Therapeutic exercise;Neuromuscular re-education;Manual techniques;Patient/family education;Passive range of motion;Vasopneumatic Device    PT Next Visit Plan cont with POC for ROM focus on ext and MD note to dr. Alvan Dame next visit    Consulted and Agree with Plan of Care Patient           Patient will benefit from skilled therapeutic intervention in order to improve the following deficits and impairments:  Pain,Abnormal gait,Decreased activity tolerance,Decreased range of motion,Decreased strength,Increased edema  Visit Diagnosis: Chronic pain of right knee  Stiffness of right knee, not elsewhere classified  Localized edema  Muscle weakness (generalized)     Problem List Patient Active Problem List   Diagnosis Date Noted  . Obese 03/15/2021  . Osteoarthritis of right knee 03/14/2021  . Status post total right knee replacement 03/14/2021  . Pre-op evaluation 03/09/2021  . Precordial pain 03/09/2021  . Spinal stenosis of lumbar region 10/28/2017  . DM (diabetes mellitus) type 2, uncontrolled, with ketoacidosis ([place]) 05/03/2014  . HLD (hyperlipidemia) 05/03/2014  . BPH (benign prostatic hyperplasia)   . Need for prophylactic vaccination and inoculation against influenza 10/16/2013  . Other and unspecified hyperlipidemia 03/14/2013  . Essential hypertension 03/14/2013  . Type II or unspecified type diabetes mellitus with unspecified complication, uncontrolled 03/14/2013  . Metabolic syndrome 50/53/9767  . Osteoarthritis of both knees 03/14/2013  . GERD (gastroesophageal reflux disease) 03/14/2013  . ELBOW PAIN 07/13/2008    Standley Brooking, PTA 03/29/2021, 12:07 PM  [facility] [address] [place], [place],  [address] Phone: [phone]   Fax:  [phone]  Name: Xavious Sharrar MRN: [id number] Date of Birth: 1951/04/17

## 2021-03-31 ENCOUNTER — Other Ambulatory Visit: Payer: Self-pay

## 2021-03-31 ENCOUNTER — Encounter: Payer: Self-pay | Admitting: Physical Therapy

## 2021-03-31 ENCOUNTER — Ambulatory Visit: Payer: Medicare Other | Attending: Orthopedic Surgery | Admitting: Physical Therapy

## 2021-03-31 DIAGNOSIS — M25561 Pain in right knee: Secondary | ICD-10-CM | POA: Diagnosis not present

## 2021-03-31 DIAGNOSIS — G8929 Other chronic pain: Secondary | ICD-10-CM | POA: Insufficient documentation

## 2021-03-31 DIAGNOSIS — M6281 Muscle weakness (generalized): Secondary | ICD-10-CM | POA: Insufficient documentation

## 2021-03-31 DIAGNOSIS — M25661 Stiffness of right knee, not elsewhere classified: Secondary | ICD-10-CM | POA: Insufficient documentation

## 2021-03-31 DIAGNOSIS — R600 Localized edema: Secondary | ICD-10-CM | POA: Insufficient documentation

## 2021-03-31 NOTE — Therapy (Signed)
[facility] [place], [place], [address] Phone: [phone]   Fax:  [phone]  Physical Therapy Treatment  Patient Details  Name: Michael Tapia MRN: [id number] Date of Birth: [birth year] Referring Provider (PT): Paralee Cancel MD   Encounter Date: 03/31/2021   PT End of Session - 03/31/21 1117    Visit Number 8    Number of Visits 12    Date for PT Re-Evaluation 04/13/21    Authorization Type FOTO AT LEAST EVERY 5TH VISIT.  PROGRESS NOTE AT 10TH VISIT.  KX MODIFIER AFTER 15 VISITS.    PT Start Time 1116    PT Stop Time 1202    PT Time Calculation (min) 46 min    Activity Tolerance Patient tolerated treatment well    Behavior During Therapy WFL for tasks assessed/performed           Past Medical History:  Diagnosis Date  . Arthritis    knees  . BPH (benign prostatic hyperplasia)   . Diabetes mellitus without complication ([place])    Type II  . GERD (gastroesophageal reflux disease)   . History of hiatal hernia   . Hyperlipidemia   . Hypertension   . Lumbar stenosis with neurogenic claudication   . Metabolic syndrome     Past Surgical History:  Procedure Laterality Date  . BACK SURGERY    . EYE SURGERY     Bilateral Cataracts  . LUMBAR LAMINECTOMY/DECOMPRESSION MICRODISCECTOMY Bilateral 10/28/2017   Procedure: Bilateral L3-4 L4-5 L5-S1 Laminotomy/Foraminotomy;  Surgeon: Kristeen Miss, MD;  Location: [facility];  Service: Neurosurgery;  Laterality: Bilateral;  Bilateral L3-4 L4-5 L5-S1 Laminotomy/Foraminotomy  . MANDIBLE FRACTURE SURGERY    . MENISCUS REPAIR     Left  . TONSILLECTOMY    . TOTAL KNEE ARTHROPLASTY Right 03/14/2021   Procedure: TOTAL KNEE ARTHROPLASTY;  Surgeon: Paralee Cancel, MD;  Location: [facility];  Service: Orthopedics;  Laterality: Right;  70 mins    There were no vitals filed for this visit.   Subjective Assessment - 03/31/21 1117    Subjective COVID-19 screening performed upon arrival. Patient  reports that MD said he could do whatever he wanted and was very pleased with him.    Pertinent History Left knee surgery, back surgery, OA, OA, h/o hiatal hernia, DM, HTN, bee sting allergy, BPH.    How long can you walk comfortably? Around home.    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    Currently in Pain? No/denies              [facility] PT Assessment - 03/31/21 0001      Assessment   Medical Diagnosis Right total knee replacement.    Referring Provider (PT) Paralee Cancel MD    Onset Date/Surgical Date 03/14/21    Next MD Visit 04/13/2021      Precautions   Precaution Comments No ultrasound.  Bee sting allergy.      Restrictions   Weight Bearing Restrictions No      ROM / Strength   AROM / PROM / Strength AROM      AROM   Overall AROM  Deficits    AROM Assessment Site Knee    Right/Left Knee Right    Right Knee Extension 13                         [facility] Adult PT Treatment/Exercise - 03/31/21 0001      Knee/Hip  Exercises: Aerobic   Recumbent Bike L1, seat 7 x10 min    Nustep L10, seat 6 x10 min      Knee/Hip Exercises: Machines for Strengthening   Cybex Leg Press 2 pl, seat 7 x20 reps      Knee/Hip Exercises: Standing   Heel Raises Limitations B toe raise x15 reps    Rocker Board 2 minutes      Knee/Hip Exercises: Supine   Heel Prop for Knee Extension 3 minutes;Weight    Heel Prop for Knee Extension Weight (lbs) 3      Modalities   Modalities Vasopneumatic      Vasopneumatic   Number Minutes Vasopneumatic  15 minutes    Vasopnuematic Location  Knee    Vasopneumatic Pressure Medium    Vasopneumatic Temperature  34                    PT Short Term Goals - 03/27/21 1431      PT SHORT TERM GOAL #1   Title ind with an initial HEP.    Baseline 03/27/21    Time 2    Period Weeks    Status Achieved      PT SHORT TERM GOAL #2   Title Full active right knee extension.    Baseline AROM -15 degrees 03/27/21     Time 2    Period Weeks    Status On-going             PT Long Term Goals - 03/29/21 1202      PT LONG TERM GOAL #1   Title Independent with an advanced HEP.    Time 4    Period Weeks    Status On-going      PT LONG TERM GOAL #2   Title Active right knee flexion to 125 degrees so the patient can perform functional tasks and do so with pain not > 2-3/10.    Baseline AROM 115 degrees 03/27/21    Time 4    Period Weeks    Status Achieved      PT LONG TERM GOAL #3   Title Increase right knee strength to 5/5 to provide good stability for accomplishment of functional activities.    Time 6    Period Weeks    Status On-going      PT LONG TERM GOAL #4   Title Perform a reciprocating stair gait with one railing with pain not > 2-3/10.    Time 4    Period Weeks    Status On-going      PT LONG TERM GOAL #5   Title Walk a community distance without assistive device.    Time 4    Period Weeks    Status On-going                 Plan - 03/31/21 1215    Clinical Impression Statement Patient presented in clinic with reports of continued limitation with knee extension but understanding that the process is ongoing. Patient progressed to leg press, toe raise to assist with quad activation to assist knee extension. Patient still guarded with palpable HS tightness during heel prop. AROM R knee extension measured as 13 deg following heel prop. Incision healing well with scabbing noted in mid to inferior incision. Normal vasopneumatic response noted following removal of the modality.    Personal Factors and Comorbidities Comorbidity 1;Comorbidity 2;Other    Comorbidities Left knee surgery, back surgery, OA, OA, h/o hiatal hernia, DM,  HTN, bee sting allergy, BPH.    Examination-Activity Limitations Other;Locomotion Level    Examination-Participation Restrictions Other    Stability/Clinical Decision Making Stable/Uncomplicated    Rehab Potential Excellent    PT Frequency 3x / week     PT Duration 4 weeks    PT Treatment/Interventions ADLs/Self Care Home Management;Cryotherapy;Electrical Stimulation;Gait training;Stair training;Functional mobility training;Therapeutic activities;Therapeutic exercise;Neuromuscular re-education;Manual techniques;Patient/family education;Passive range of motion;Vasopneumatic Device    PT Next Visit Plan cont with POC for ROM focus on ext    Consulted and Agree with Plan of Care Patient           Patient will benefit from skilled therapeutic intervention in order to improve the following deficits and impairments:  Pain,Abnormal gait,Decreased activity tolerance,Decreased range of motion,Decreased strength,Increased edema  Visit Diagnosis: Chronic pain of right knee  Stiffness of right knee, not elsewhere classified  Localized edema  Muscle weakness (generalized)     Problem List Patient Active Problem List   Diagnosis Date Noted  . Obese 03/15/2021  . Osteoarthritis of right knee 03/14/2021  . Status post total right knee replacement 03/14/2021  . Pre-op evaluation 03/09/2021  . Precordial pain 03/09/2021  . Spinal stenosis of lumbar region 10/28/2017  . DM (diabetes mellitus) type 2, uncontrolled, with ketoacidosis ([place]) 05/03/2014  . HLD (hyperlipidemia) 05/03/2014  . BPH (benign prostatic hyperplasia)   . Need for prophylactic vaccination and inoculation against influenza 10/16/2013  . Other and unspecified hyperlipidemia 03/14/2013  . Essential hypertension 03/14/2013  . Type II or unspecified type diabetes mellitus with unspecified complication, uncontrolled 03/14/2013  . Metabolic syndrome 78/29/5621  . Osteoarthritis of both knees 03/14/2013  . GERD (gastroesophageal reflux disease) 03/14/2013  . ELBOW PAIN 07/13/2008    Standley Brooking, PTA 03/31/2021, 12:18 PM  [facility] [address] [place], [place], [address] Phone: [phone]   Fax:  [phone]  Name:  Michael Tapia MRN: [id number] Date of Birth: 09/17/1951

## 2021-04-05 ENCOUNTER — Ambulatory Visit: Payer: Medicare Other | Admitting: Physical Therapy

## 2021-04-05 ENCOUNTER — Other Ambulatory Visit: Payer: Self-pay

## 2021-04-05 DIAGNOSIS — M25661 Stiffness of right knee, not elsewhere classified: Secondary | ICD-10-CM | POA: Diagnosis not present

## 2021-04-05 DIAGNOSIS — M25561 Pain in right knee: Secondary | ICD-10-CM | POA: Diagnosis not present

## 2021-04-05 DIAGNOSIS — M6281 Muscle weakness (generalized): Secondary | ICD-10-CM | POA: Diagnosis not present

## 2021-04-05 DIAGNOSIS — G8929 Other chronic pain: Secondary | ICD-10-CM

## 2021-04-05 DIAGNOSIS — R600 Localized edema: Secondary | ICD-10-CM | POA: Diagnosis not present

## 2021-04-05 NOTE — Therapy (Signed)
[facility] [place], [place], [address] Phone: [phone]   Fax:  [phone]  Physical Therapy Treatment  Patient Details  Name: Michael Tapia MRN: [id number] Date of Birth: Mar 31, 1951 Referring Provider (PT): Paralee Cancel MD   Encounter Date: 04/05/2021   PT End of Session - 04/05/21 1126    Visit Number 9    Number of Visits 12    Date for PT Re-Evaluation 04/13/21    Authorization Type FOTO AT LEAST EVERY 5TH VISIT.  PROGRESS NOTE AT 10TH VISIT.  KX MODIFIER AFTER 15 VISITS.    PT Start Time 1115    PT Stop Time 1208    PT Time Calculation (min) 53 min    Activity Tolerance Patient tolerated treatment well    Behavior During Therapy WFL for tasks assessed/performed           Past Medical History:  Diagnosis Date  . Arthritis    knees  . BPH (benign prostatic hyperplasia)   . Diabetes mellitus without complication ([place])    Type II  . GERD (gastroesophageal reflux disease)   . History of hiatal hernia   . Hyperlipidemia   . Hypertension   . Lumbar stenosis with neurogenic claudication   . Metabolic syndrome     Past Surgical History:  Procedure Laterality Date  . BACK SURGERY    . EYE SURGERY     Bilateral Cataracts  . LUMBAR LAMINECTOMY/DECOMPRESSION MICRODISCECTOMY Bilateral 10/28/2017   Procedure: Bilateral L3-4 L4-5 L5-S1 Laminotomy/Foraminotomy;  Surgeon: Kristeen Miss, MD;  Location: [facility];  Service: Neurosurgery;  Laterality: Bilateral;  Bilateral L3-4 L4-5 L5-S1 Laminotomy/Foraminotomy  . MANDIBLE FRACTURE SURGERY    . MENISCUS REPAIR     Left  . TONSILLECTOMY    . TOTAL KNEE ARTHROPLASTY Right 03/14/2021   Procedure: TOTAL KNEE ARTHROPLASTY;  Surgeon: Paralee Cancel, MD;  Location: [facility];  Service: Orthopedics;  Laterality: Right;  70 mins    There were no vitals filed for this visit.   Subjective Assessment - 04/05/21 1124    Subjective COVID-19 screening performed upon arrival. Patient  reported increased soreness from sleeping with leg straight    Pertinent History Left knee surgery, back surgery, OA, OA, h/o hiatal hernia, DM, HTN, bee sting allergy, BPH.    How long can you walk comfortably? Around home.    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    Currently in Pain? Yes    Pain Score 8     Pain Location Knee    Pain Orientation Right    Pain Descriptors / Indicators Discomfort    Pain Type Surgical pain    Pain Onset 1 to 4 weeks ago    Pain Frequency Constant    Aggravating Factors  certain movements    Pain Relieving Factors rest              [facility] PT Assessment - 04/05/21 0001      AROM   AROM Assessment Site Knee    Right/Left Knee Right    Right Knee Extension -10      PROM   PROM Assessment Site Knee    Right/Left Knee Right    Right Knee Extension -6                         [facility] Adult PT Treatment/Exercise - 04/05/21 0001      Knee/Hip Exercises: Aerobic   Recumbent Bike  70mn    Nustep L3 x 184m      Knee/Hip Exercises: Standing   Forward Step Up Right;20 reps;Step Height: 6";Hand Hold: 1    Step Down Right;20 reps;Step Height: 6";Hand Hold: 1    Rocker Board 2 minutes      Vasopneumatic   Number Minutes Vasopneumatic  15 minutes    Vasopnuematic Location  Knee    Vasopneumatic Pressure Medium    Vasopneumatic Temperature  34 edema      Manual Therapy   Manual Therapy Passive ROM;Soft tissue mobilization    Soft tissue mobilization STW to R HS to reduce tone    Passive ROM manual stretching for right knee to improve mobility                    PT Short Term Goals - 04/05/21 1127      PT SHORT TERM GOAL #1   Title ind with an initial HEP.    Baseline 03/27/21    Time 2    Period Weeks    Status Achieved      PT SHORT TERM GOAL #2   Title Full active right knee extension.    Baseline AROM -10 degrees 04/05/21    Time 2    Period Weeks    Status On-going              PT Long Term Goals - 04/05/21 1127      PT LONG TERM GOAL #1   Title Independent with an advanced HEP.    Time 4    Period Weeks    Status On-going      PT LONG TERM GOAL #2   Title Active right knee flexion to 125 degrees so the patient can perform functional tasks and do so with pain not > 2-3/10.    Time 4    Period Weeks    Status Achieved      PT LONG TERM GOAL #3   Title Increase right knee strength to 5/5 to provide good stability for accomplishment of functional activities.    Time 6    Period Weeks    Status On-going      PT LONG TERM GOAL #4   Title Perform a reciprocating stair gait with one railing with pain not > 2-3/10.    Time 4    Period Weeks    Status On-going      PT LONG TERM GOAL #5   Title Walk a community distance without assistive device.    Baseline no assist device 04/05/21    Time 4    Period Weeks    Status Achieved                 Plan - 04/05/21 1140    Clinical Impression Statement Patient tolerated treatment well today with some increased pain. Patient able to progress with step ups/downs today with some difficulty due to quad control. patient has continued to improve with ROM in right knee with ext limitations. patient not using assist device anymore. Patient met LTG#5 with remaining ongoing.    Personal Factors and Comorbidities Comorbidity 1;Comorbidity 2;Other    Comorbidities Left knee surgery, back surgery, OA, OA, h/o hiatal hernia, DM, HTN, bee sting allergy, BPH.    Examination-Activity Limitations Other;Locomotion Level    Examination-Participation Restrictions Other    Stability/Clinical Decision Making Stable/Uncomplicated    Rehab Potential Excellent    PT Frequency 3x / week    PT Duration  4 weeks    PT Treatment/Interventions ADLs/Self Care Home Management;Cryotherapy;Electrical Stimulation;Gait training;Stair training;Functional mobility training;Therapeutic activities;Therapeutic exercise;Neuromuscular  re-education;Manual techniques;Patient/family education;Passive range of motion;Vasopneumatic Device    PT Next Visit Plan cont with POC for ROM focus on ext 10th visit progress and FOTO next treatment    Consulted and Agree with Plan of Care Patient           Patient will benefit from skilled therapeutic intervention in order to improve the following deficits and impairments:  Pain,Abnormal gait,Decreased activity tolerance,Decreased range of motion,Decreased strength,Increased edema  Visit Diagnosis: Chronic pain of right knee  Stiffness of right knee, not elsewhere classified  Localized edema  Muscle weakness (generalized)     Problem List Patient Active Problem List   Diagnosis Date Noted  . Obese 03/15/2021  . Osteoarthritis of right knee 03/14/2021  . Status post total right knee replacement 03/14/2021  . Pre-op evaluation 03/09/2021  . Precordial pain 03/09/2021  . Spinal stenosis of lumbar region 10/28/2017  . DM (diabetes mellitus) type 2, uncontrolled, with ketoacidosis ([place]) 05/03/2014  . HLD (hyperlipidemia) 05/03/2014  . BPH (benign prostatic hyperplasia)   . Need for prophylactic vaccination and inoculation against influenza 10/16/2013  . Other and unspecified hyperlipidemia 03/14/2013  . Essential hypertension 03/14/2013  . Type II or unspecified type diabetes mellitus with unspecified complication, uncontrolled 03/14/2013  . Metabolic syndrome 95/74/7340  . Osteoarthritis of both knees 03/14/2013  . GERD (gastroesophageal reflux disease) 03/14/2013  . ELBOW PAIN 07/13/2008    Phillips Climes, PTA 04/05/2021, 12:08 PM  [facility] [address] [place], [place], [address] Phone: [phone]   Fax:  [phone]  Name: Michael Tapia MRN: [id number] Date of Birth: [birth year]

## 2021-04-07 ENCOUNTER — Ambulatory Visit: Payer: Medicare Other | Admitting: Physical Therapy

## 2021-04-07 ENCOUNTER — Other Ambulatory Visit: Payer: Self-pay

## 2021-04-07 ENCOUNTER — Encounter: Payer: Self-pay | Admitting: Physical Therapy

## 2021-04-07 DIAGNOSIS — M6281 Muscle weakness (generalized): Secondary | ICD-10-CM | POA: Diagnosis not present

## 2021-04-07 DIAGNOSIS — M25561 Pain in right knee: Secondary | ICD-10-CM | POA: Diagnosis not present

## 2021-04-07 DIAGNOSIS — M25661 Stiffness of right knee, not elsewhere classified: Secondary | ICD-10-CM

## 2021-04-07 DIAGNOSIS — R600 Localized edema: Secondary | ICD-10-CM

## 2021-04-07 DIAGNOSIS — G8929 Other chronic pain: Secondary | ICD-10-CM

## 2021-04-07 NOTE — Therapy (Signed)
[facility] [address] [place], [place], [address] Phone: [phone]   Fax:  [phone]  Physical Therapy Treatment  Patient Details  Name: Michael Tapia MRN: [id number] Date of Birth: July 05, 1951 Referring Provider (PT): Durene Romans MD   Encounter Date: 04/07/2021   PT End of Session - 04/07/21 1125    Visit Number 10    Number of Visits 12    Date for PT Re-Evaluation 04/13/21    Authorization Type FOTO AT LEAST EVERY 5TH VISIT.  PROGRESS NOTE AT 10TH VISIT.  KX MODIFIER AFTER 15 VISITS.    PT Start Time 1115    PT Stop Time 1205    PT Time Calculation (min) 50 min    Activity Tolerance Patient tolerated treatment well    Behavior During Therapy WFL for tasks assessed/performed           Past Medical History:  Diagnosis Date  . Arthritis    knees  . BPH (benign prostatic hyperplasia)   . Diabetes mellitus without complication ([facility])    Type II  . GERD (gastroesophageal reflux disease)   . History of hiatal hernia   . Hyperlipidemia   . Hypertension   . Lumbar stenosis with neurogenic claudication   . Metabolic syndrome     Past Surgical History:  Procedure Laterality Date  . BACK SURGERY    . EYE SURGERY     Bilateral Cataracts  . LUMBAR LAMINECTOMY/DECOMPRESSION MICRODISCECTOMY Bilateral 10/28/2017   Procedure: Bilateral L3-4 L4-5 L5-S1 Laminotomy/Foraminotomy;  Surgeon: Barnett Abu, MD;  Location: [facility];  Service: Neurosurgery;  Laterality: Bilateral;  Bilateral L3-4 L4-5 L5-S1 Laminotomy/Foraminotomy  . MANDIBLE FRACTURE SURGERY    . MENISCUS REPAIR     Left  . TONSILLECTOMY    . TOTAL KNEE ARTHROPLASTY Right 03/14/2021   Procedure: TOTAL KNEE ARTHROPLASTY;  Surgeon: Durene Romans, MD;  Location: [facility];  Service: Orthopedics;  Laterality: Right;  70 mins    There were no vitals filed for this visit.   Subjective Assessment - 04/07/21 1124    Subjective COVID-19 screening performed upon arrival. No new  complaints.    Pertinent History Left knee surgery, back surgery, OA, OA, h/o hiatal hernia, DM, HTN, bee sting allergy, BPH.    How long can you walk comfortably? Around home.    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    Currently in Pain? Yes    Pain Score 3     Pain Location Knee    Pain Orientation Right    Pain Descriptors / Indicators Discomfort    Pain Type Surgical pain    Pain Onset 1 to 4 weeks ago    Pain Frequency Constant              [facility] PT Assessment - 04/07/21 0001      Assessment   Medical Diagnosis Right total knee replacement.    Referring Provider (PT) Durene Romans MD    Onset Date/Surgical Date 03/14/21    Next MD Visit 04/13/2021      Precautions   Precaution Comments No ultrasound.  Bee sting allergy.      Restrictions   Weight Bearing Restrictions No      Observation/Other Assessments   Focus on Therapeutic Outcomes (FOTO)  21% 10th visit                         [facility] Adult PT Treatment/Exercise - 04/07/21 0001  Knee/Hip Exercises: Aerobic   Recumbent Bike x10 min    Nustep L3 x 88min      Knee/Hip Exercises: Machines for Strengthening   Cybex Knee Extension 10# 2x10 reps    Cybex Leg Press 2 pl, seat 7 x30 reps      Modalities   Modalities Vasopneumatic      Vasopneumatic   Number Minutes Vasopneumatic  10 minutes    Vasopnuematic Location  Knee    Vasopneumatic Pressure Medium    Vasopneumatic Temperature  34 edema      Manual Therapy   Manual Therapy Myofascial release    Myofascial Release IASTW to R HS, superior calf to reduce tone and improve ROM                    PT Short Term Goals - 04/05/21 1127      PT SHORT TERM GOAL #1   Title ind with an initial HEP.    Baseline 03/27/21    Time 2    Period Weeks    Status Achieved      PT SHORT TERM GOAL #2   Title Full active right knee extension.    Baseline AROM -10 degrees 04/05/21    Time 2    Period Weeks     Status On-going             PT Long Term Goals - 04/05/21 1127      PT LONG TERM GOAL #1   Title Independent with an advanced HEP.    Time 4    Period Weeks    Status On-going      PT LONG TERM GOAL #2   Title Active right knee flexion to 125 degrees so the patient can perform functional tasks and do so with pain not > 2-3/10.    Time 4    Period Weeks    Status Achieved      PT LONG TERM GOAL #3   Title Increase right knee strength to 5/5 to provide good stability for accomplishment of functional activities.    Time 6    Period Weeks    Status On-going      PT LONG TERM GOAL #4   Title Perform a reciprocating stair gait with one railing with pain not > 2-3/10.    Time 4    Period Weeks    Status On-going      PT LONG TERM GOAL #5   Title Walk a community distance without assistive device.    Baseline no assist device 04/05/21    Time 4    Period Weeks    Status Achieved                 Plan - 04/07/21 1208    Clinical Impression Statement Patient presented in clinic and was progressed to more LE strengthening. Patient ambulating with knee flexion in stance but able to complete all therex well. IASTW completed to R HS and calf in supine with heel prop with no reports of excessive tenderness and pain. Normal vasopneumatic response noted following removal of the modality.    Personal Factors and Comorbidities Comorbidity 1;Comorbidity 2;Other    Comorbidities Left knee surgery, back surgery, OA, OA, h/o hiatal hernia, DM, HTN, bee sting allergy, BPH.    Examination-Activity Limitations Other;Locomotion Level    Examination-Participation Restrictions Other    Stability/Clinical Decision Making Stable/Uncomplicated    Rehab Potential Excellent    PT Frequency 3x / week  PT Duration 4 weeks    PT Treatment/Interventions ADLs/Self Care Home Management;Cryotherapy;Electrical Stimulation;Gait training;Stair training;Functional mobility training;Therapeutic  activities;Therapeutic exercise;Neuromuscular re-education;Manual techniques;Patient/family education;Passive range of motion;Vasopneumatic Device    PT Next Visit Plan cont with POC for ROM focus on ext    Consulted and Agree with Plan of Care Patient           Patient will benefit from skilled therapeutic intervention in order to improve the following deficits and impairments:  Pain,Abnormal gait,Decreased activity tolerance,Decreased range of motion,Decreased strength,Increased edema  Visit Diagnosis: Chronic pain of right knee  Stiffness of right knee, not elsewhere classified  Localized edema  Muscle weakness (generalized)     Problem List Patient Active Problem List   Diagnosis Date Noted  . Obese 03/15/2021  . Osteoarthritis of right knee 03/14/2021  . Status post total right knee replacement 03/14/2021  . Pre-op evaluation 03/09/2021  . Precordial pain 03/09/2021  . Spinal stenosis of lumbar region 10/28/2017  . DM (diabetes mellitus) type 2, uncontrolled, with ketoacidosis ([place]) 05/03/2014  . HLD (hyperlipidemia) 05/03/2014  . BPH (benign prostatic hyperplasia)   . Need for prophylactic vaccination and inoculation against influenza 10/16/2013  . Other and unspecified hyperlipidemia 03/14/2013  . Essential hypertension 03/14/2013  . Type II or unspecified type diabetes mellitus with unspecified complication, uncontrolled 03/14/2013  . Metabolic syndrome 14/43/1540  . Osteoarthritis of both knees 03/14/2013  . GERD (gastroesophageal reflux disease) 03/14/2013  . ELBOW PAIN 07/13/2008    Standley Brooking, PTA 04/07/21 12:15 PM   [facility] [place], [place], [address] Phone: [phone]   Fax:  [phone]  Name: Marin Milley MRN: [id number] Date of Birth: [birth year]  Progress Note Reporting Period 03/16/21 to 04/07/21.  See note below for Objective Data and Assessment of Progress/Goals. Excellent  progress with LTG #2 met thus far.    Mali Applegate MPT

## 2021-04-10 ENCOUNTER — Ambulatory Visit: Payer: Medicare Other | Admitting: Physical Therapy

## 2021-04-10 ENCOUNTER — Other Ambulatory Visit: Payer: Self-pay

## 2021-04-10 DIAGNOSIS — M25561 Pain in right knee: Secondary | ICD-10-CM | POA: Diagnosis not present

## 2021-04-10 DIAGNOSIS — G8929 Other chronic pain: Secondary | ICD-10-CM | POA: Diagnosis not present

## 2021-04-10 DIAGNOSIS — R600 Localized edema: Secondary | ICD-10-CM

## 2021-04-10 DIAGNOSIS — M25661 Stiffness of right knee, not elsewhere classified: Secondary | ICD-10-CM

## 2021-04-10 DIAGNOSIS — M6281 Muscle weakness (generalized): Secondary | ICD-10-CM

## 2021-04-10 NOTE — Therapy (Signed)
[facility] [place], [place], [address] Phone: [phone]   Fax:  [phone]  Physical Therapy Treatment  Patient Details  Name: Michael Tapia MRN: [id number] Date of Birth: April 12, 1951 Referring Provider (PT): Paralee Cancel MD   Encounter Date: 04/10/2021   PT End of Session - 04/10/21 0945    Visit Number 11    Number of Visits 12    Date for PT Re-Evaluation 04/13/21    Authorization Type FOTO AT LEAST EVERY 5TH VISIT.  PROGRESS NOTE AT 10TH VISIT.  KX MODIFIER AFTER 15 VISITS.    PT Start Time 0945    PT Stop Time 1036    PT Time Calculation (min) 51 min    Activity Tolerance Patient tolerated treatment well    Behavior During Therapy WFL for tasks assessed/performed           Past Medical History:  Diagnosis Date  . Arthritis    knees  . BPH (benign prostatic hyperplasia)   . Diabetes mellitus without complication ([place])    Type II  . GERD (gastroesophageal reflux disease)   . History of hiatal hernia   . Hyperlipidemia   . Hypertension   . Lumbar stenosis with neurogenic claudication   . Metabolic syndrome     Past Surgical History:  Procedure Laterality Date  . BACK SURGERY    . EYE SURGERY     Bilateral Cataracts  . LUMBAR LAMINECTOMY/DECOMPRESSION MICRODISCECTOMY Bilateral 10/28/2017   Procedure: Bilateral L3-4 L4-5 L5-S1 Laminotomy/Foraminotomy;  Surgeon: Kristeen Miss, MD;  Location: [place];  Service: Neurosurgery;  Laterality: Bilateral;  Bilateral L3-4 L4-5 L5-S1 Laminotomy/Foraminotomy  . MANDIBLE FRACTURE SURGERY    . MENISCUS REPAIR     Left  . TONSILLECTOMY    . TOTAL KNEE ARTHROPLASTY Right 03/14/2021   Procedure: TOTAL KNEE ARTHROPLASTY;  Surgeon: Paralee Cancel, MD;  Location: [facility];  Service: Orthopedics;  Laterality: Right;  70 mins    There were no vitals filed for this visit.   Subjective Assessment - 04/10/21 0949    Subjective COVID-19 screening performed upon arrival.  Patient doing well just sore when trying to rest and does better with activity.    Pertinent History Left knee surgery, back surgery, OA, OA, h/o hiatal hernia, DM, HTN, bee sting allergy, BPH.    How long can you walk comfortably? Around home.    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    Currently in Pain? Yes    Pain Score 2     Pain Location Knee    Pain Orientation Right    Pain Descriptors / Indicators Discomfort    Pain Type Surgical pain    Pain Onset 1 to 4 weeks ago    Pain Frequency Intermittent    Aggravating Factors  laying down to sleep    Pain Relieving Factors movement              [facility] PT Assessment - 04/10/21 0001      AROM   AROM Assessment Site Knee    Right/Left Knee Right    Right Knee Extension -9      PROM   PROM Assessment Site Knee    Right/Left Knee Right    Right Knee Extension -5                         [facility] Adult PT Treatment/Exercise - 04/10/21 0001  Knee/Hip Exercises: Aerobic   Recumbent Bike x10 min    Nustep L8-10 x 39min      Knee/Hip Exercises: Machines for Strengthening   Cybex Knee Extension 10# 2x10 reps      Knee/Hip Exercises: Standing   Rocker Board --      Vasopneumatic   Number Minutes Vasopneumatic  10 minutes    Vasopnuematic Location  Knee    Vasopneumatic Pressure Medium    Vasopneumatic Temperature  34 edema      Manual Therapy   Manual Therapy Passive ROM    Passive ROM manual stretching for right knee ext to improve mobility                    PT Short Term Goals - 04/10/21 1010      PT SHORT TERM GOAL #1   Title ind with an initial HEP.    Baseline 03/27/21    Time 2    Period Weeks    Status Achieved      PT SHORT TERM GOAL #2   Title Full active right knee extension.    Baseline AROM -9 degrees 04/10/21    Time 2    Period Weeks    Status On-going             PT Long Term Goals - 04/10/21 1010      PT LONG TERM GOAL #1   Title  Independent with an advanced HEP.    Time 4    Period Weeks    Status On-going      PT LONG TERM GOAL #2   Title Active right knee flexion to 125 degrees so the patient can perform functional tasks and do so with pain not > 2-3/10.    Time 4    Period Weeks    Status Achieved      PT LONG TERM GOAL #3   Title Increase right knee strength to 5/5 to provide good stability for accomplishment of functional activities.    Period Weeks    Status On-going      PT LONG TERM GOAL #4   Title Perform a reciprocating stair gait with one railing with pain not > 2-3/10.    Time 4    Period Weeks    Status On-going      PT LONG TERM GOAL #5   Title Walk a community distance without assistive device.    Baseline no assist device 04/05/21    Time 4    Period Weeks    Status Achieved                 Plan - 04/10/21 1026    Clinical Impression Statement Patient tolerated treatment well today. Patient has reported doing yard work/ADL's with greater ease and at end of day when trying to sleep pain kn knee increases. Patient has improved with active and passive ROM today for right knee ext. Patient progressing today. Goals ongoing.    Personal Factors and Comorbidities Comorbidity 1;Comorbidity 2;Other    Comorbidities Left knee surgery, back surgery, OA, OA, h/o hiatal hernia, DM, HTN, bee sting allergy, BPH.    Examination-Activity Limitations Other;Locomotion Level    Examination-Participation Restrictions Other    Stability/Clinical Decision Making Stable/Uncomplicated    Rehab Potential Excellent    PT Frequency 3x / week    PT Duration 4 weeks    PT Treatment/Interventions ADLs/Self Care Home Management;Cryotherapy;Electrical Stimulation;Gait training;Stair training;Functional mobility training;Therapeutic activities;Therapeutic exercise;Neuromuscular re-education;Manual techniques;Patient/family education;Passive  range of motion;Vasopneumatic Device    PT Next Visit Plan cont with POC  for ROM focus on ext    Consulted and Agree with Plan of Care Patient           Patient will benefit from skilled therapeutic intervention in order to improve the following deficits and impairments:  Pain,Abnormal gait,Decreased activity tolerance,Decreased range of motion,Decreased strength,Increased edema  Visit Diagnosis: Chronic pain of right knee  Stiffness of right knee, not elsewhere classified  Localized edema  Muscle weakness (generalized)     Problem List Patient Active Problem List   Diagnosis Date Noted  . Obese 03/15/2021  . Osteoarthritis of right knee 03/14/2021  . Status post total right knee replacement 03/14/2021  . Pre-op evaluation 03/09/2021  . Precordial pain 03/09/2021  . Spinal stenosis of lumbar region 10/28/2017  . DM (diabetes mellitus) type 2, uncontrolled, with ketoacidosis ([place]) 05/03/2014  . HLD (hyperlipidemia) 05/03/2014  . BPH (benign prostatic hyperplasia)   . Need for prophylactic vaccination and inoculation against influenza 10/16/2013  . Other and unspecified hyperlipidemia 03/14/2013  . Essential hypertension 03/14/2013  . Type II or unspecified type diabetes mellitus with unspecified complication, uncontrolled 03/14/2013  . Metabolic syndrome 58/68/2574  . Osteoarthritis of both knees 03/14/2013  . GERD (gastroesophageal reflux disease) 03/14/2013  . ELBOW PAIN 07/13/2008   Ladean Raya, PTA 04/10/21 10:48 AM  [facility] [place], [place], [address] Phone: [phone]   Fax:  [phone]  Name: Michael Tapia MRN: [id number] Date of Birth: 1951-02-15

## 2021-04-13 ENCOUNTER — Other Ambulatory Visit: Payer: Self-pay

## 2021-04-13 ENCOUNTER — Ambulatory Visit: Payer: Medicare Other | Admitting: Physical Therapy

## 2021-04-13 DIAGNOSIS — G8929 Other chronic pain: Secondary | ICD-10-CM

## 2021-04-13 DIAGNOSIS — M25661 Stiffness of right knee, not elsewhere classified: Secondary | ICD-10-CM

## 2021-04-13 DIAGNOSIS — M25561 Pain in right knee: Secondary | ICD-10-CM | POA: Diagnosis not present

## 2021-04-13 DIAGNOSIS — M6281 Muscle weakness (generalized): Secondary | ICD-10-CM | POA: Diagnosis not present

## 2021-04-13 DIAGNOSIS — R600 Localized edema: Secondary | ICD-10-CM

## 2021-04-13 NOTE — Therapy (Signed)
[facility] [place], [place], [address] Phone: [phone]   Fax:  [phone]  Physical Therapy Treatment  Patient Details  Name: Michael Tapia MRN: [id number] Date of Birth: March 23, 1951 Referring Provider (PT): Paralee Cancel MD   Encounter Date: 04/13/2021   PT End of Session - 04/13/21 1148    Visit Number 12    Number of Visits 18    Date for PT Re-Evaluation 05/04/21    Authorization Type FOTO 10th vist score 79  PROGRESS NOTE AT 10TH VISIT.  KX MODIFIER AFTER 15 VISITS.    PT Start Time 1115    PT Stop Time 1202    PT Time Calculation (min) 47 min    Activity Tolerance Patient limited by pain    Behavior During Therapy [facility] for tasks assessed/performed           Past Medical History:  Diagnosis Date  . Arthritis    knees  . BPH (benign prostatic hyperplasia)   . Diabetes mellitus without complication ([place])    Type II  . GERD (gastroesophageal reflux disease)   . History of hiatal hernia   . Hyperlipidemia   . Hypertension   . Lumbar stenosis with neurogenic claudication   . Metabolic syndrome     Past Surgical History:  Procedure Laterality Date  . BACK SURGERY    . EYE SURGERY     Bilateral Cataracts  . LUMBAR LAMINECTOMY/DECOMPRESSION MICRODISCECTOMY Bilateral 10/28/2017   Procedure: Bilateral L3-4 L4-5 L5-S1 Laminotomy/Foraminotomy;  Surgeon: Kristeen Miss, MD;  Location: [place];  Service: Neurosurgery;  Laterality: Bilateral;  Bilateral L3-4 L4-5 L5-S1 Laminotomy/Foraminotomy  . MANDIBLE FRACTURE SURGERY    . MENISCUS REPAIR     Left  . TONSILLECTOMY    . TOTAL KNEE ARTHROPLASTY Right 03/14/2021   Procedure: TOTAL KNEE ARTHROPLASTY;  Surgeon: Paralee Cancel, MD;  [facility];  Service: Orthopedics;  Laterality: Right;  70 mins    There were no vitals filed for this visit.   Subjective Assessment - 04/13/21 1125    Subjective COVID-19 screening performed upon arrival. Patient arrived with  increased pain in knee for unknown reason esp at night    Pertinent History Left knee surgery, back surgery, OA, OA, h/o hiatal hernia, DM, HTN, bee sting allergy, BPH.    How long can you walk comfortably? Around home.    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    Currently in Pain? Yes    Pain Score 5     Pain Location Knee    Pain Orientation Right    Pain Descriptors / Indicators Discomfort    Pain Type Surgical pain    Pain Onset More than a month ago    Pain Frequency Intermittent    Aggravating Factors  sitting, sleeping, walking    Pain Relieving Factors movement of knee                             [facility] Adult PT Treatment/Exercise - 04/13/21 0001      Knee/Hip Exercises: Aerobic   Recumbent Bike L1 7min      Modalities   Modalities Electrical Stimulation      Electrical Stimulation   Electrical Stimulation Location medial knee    Electrical Stimulation Action premod    Electrical Stimulation Parameters 1-10hz  x10min    Electrical Stimulation Goals Edema;Pain      Vasopneumatic   Number Minutes  Vasopneumatic  10 minutes    Vasopnuematic Location  Knee    Vasopneumatic Pressure Medium    Vasopneumatic Temperature  34 edema/pain      Manual Therapy   Manual Therapy Passive ROM;Soft tissue mobilization    Manual therapy comments patella mobs    Soft tissue mobilization STW to R HS to reduce tone and medial knee to decrease pain    Passive ROM manual stretching for right knee ext to improve mobility                    PT Short Term Goals - 04/10/21 1010      PT SHORT TERM GOAL #1   Title ind with an initial HEP.    Baseline 03/27/21    Time 2    Period Weeks    Status Achieved      PT SHORT TERM GOAL #2   Title Full active right knee extension.    Baseline AROM -9 degrees 04/10/21    Time 2    Period Weeks    Status On-going             PT Long Term Goals - 04/10/21 1010      PT LONG TERM GOAL  #1   Title Independent with an advanced HEP.    Time 4    Period Weeks    Status On-going      PT LONG TERM GOAL #2   Title Active right knee flexion to 125 degrees so the patient can perform functional tasks and do so with pain not > 2-3/10.    Time 4    Period Weeks    Status Achieved      PT LONG TERM GOAL #3   Title Increase right knee strength to 5/5 to provide good stability for accomplishment of functional activities.    Period Weeks    Status On-going      PT LONG TERM GOAL #4   Title Perform a reciprocating stair gait with one railing with pain not > 2-3/10.    Time 4    Period Weeks    Status On-going      PT LONG TERM GOAL #5   Title Walk a community distance without assistive device.    Baseline no assist device 04/05/21    Time 4    Period Weeks    Status Achieved                 Plan - 04/13/21 1205    Clinical Impression Statement Patient tolerated treatment fair due to pain in right medial knee that has been ongoing. Today focused on ext ROM and manual STW to medial knee and HS to decrease pain and tone. Patient ROM continues to reamin at -10 degrees today. Current goals ongoing. patient responded well to modalities today and ended with biofreeze.    Personal Factors and Comorbidities Comorbidity 1;Comorbidity 2;Other    Comorbidities Left knee surgery, back surgery, OA, OA, h/o hiatal hernia, DM, HTN, bee sting allergy, BPH.    Examination-Activity Limitations Other;Locomotion Level    Examination-Participation Restrictions Other    Stability/Clinical Decision Making Stable/Uncomplicated    Rehab Potential Excellent    PT Frequency 3x / week    PT Duration 4 weeks    PT Treatment/Interventions ADLs/Self Care Home Management;Cryotherapy;Electrical Stimulation;Gait training;Stair training;Functional mobility training;Therapeutic activities;Therapeutic exercise;Neuromuscular re-education;Manual techniques;Patient/family education;Passive range of  motion;Vasopneumatic Device    PT Next Visit Plan cont with POC for ROM focus  on ext and manual STW/modalities PRN    Consulted and Agree with Plan of Care Patient           Patient will benefit from skilled therapeutic intervention in order to improve the following deficits and impairments:  Pain,Abnormal gait,Decreased activity tolerance,Decreased range of motion,Decreased strength,Increased edema  Visit Diagnosis: Chronic pain of right knee  Stiffness of right knee, not elsewhere classified  Localized edema  Muscle weakness (generalized)     Problem List Patient Active Problem List   Diagnosis Date Noted  . Obese 03/15/2021  . Osteoarthritis of right knee 03/14/2021  . Status post total right knee replacement 03/14/2021  . Pre-op evaluation 03/09/2021  . Precordial pain 03/09/2021  . Spinal stenosis of lumbar region 10/28/2017  . DM (diabetes mellitus) type 2, uncontrolled, with ketoacidosis ([place]) 05/03/2014  . HLD (hyperlipidemia) 05/03/2014  . BPH (benign prostatic hyperplasia)   . Need for prophylactic vaccination and inoculation against influenza 10/16/2013  . Other and unspecified hyperlipidemia 03/14/2013  . Essential hypertension 03/14/2013  . Type II or unspecified type diabetes mellitus with unspecified complication, uncontrolled 03/14/2013  . Metabolic syndrome 37/36/6815  . Osteoarthritis of both knees 03/14/2013  . GERD (gastroesophageal reflux disease) 03/14/2013  . ELBOW PAIN 07/13/2008    Phillips Climes, PTA 04/13/2021, 12:12 PM  [facility] [address] [place], [place], [address] Phone: [phone]   Fax:  [phone]  Name: Michael Tapia MRN: [id number] Date of Birth: 1951/07/30

## 2021-04-20 ENCOUNTER — Other Ambulatory Visit: Payer: Self-pay

## 2021-04-20 ENCOUNTER — Ambulatory Visit: Payer: Medicare Other | Admitting: *Deleted

## 2021-04-20 DIAGNOSIS — M6281 Muscle weakness (generalized): Secondary | ICD-10-CM

## 2021-04-20 DIAGNOSIS — M25561 Pain in right knee: Secondary | ICD-10-CM | POA: Diagnosis not present

## 2021-04-20 DIAGNOSIS — G8929 Other chronic pain: Secondary | ICD-10-CM

## 2021-04-20 DIAGNOSIS — R600 Localized edema: Secondary | ICD-10-CM | POA: Diagnosis not present

## 2021-04-20 DIAGNOSIS — M25661 Stiffness of right knee, not elsewhere classified: Secondary | ICD-10-CM | POA: Diagnosis not present

## 2021-04-20 NOTE — Therapy (Signed)
[facility] [place], [place], [address] Phone: [phone]   Fax:  [phone]  Physical Therapy Treatment  Patient Details  Name: Michael Tapia MRN: [id number] Date of Birth: September 24, 1951 Referring Provider (PT): Paralee Cancel MD   Encounter Date: 04/20/2021   PT End of Session - 04/20/21 0837    Visit Number 13    Number of Visits 18    Authorization Type FOTO 10th vist score 79  PROGRESS NOTE AT 10TH VISIT.  KX MODIFIER AFTER 15 VISITS.    PT Start Time 0815    PT Stop Time 0910    PT Time Calculation (min) 55 min           Past Medical History:  Diagnosis Date  . Arthritis    knees  . BPH (benign prostatic hyperplasia)   . Diabetes mellitus without complication ([place])    Type II  . GERD (gastroesophageal reflux disease)   . History of hiatal hernia   . Hyperlipidemia   . Hypertension   . Lumbar stenosis with neurogenic claudication   . Metabolic syndrome     Past Surgical History:  Procedure Laterality Date  . BACK SURGERY    . EYE SURGERY     Bilateral Cataracts  . LUMBAR LAMINECTOMY/DECOMPRESSION MICRODISCECTOMY Bilateral 10/28/2017   Procedure: Bilateral L3-4 L4-5 L5-S1 Laminotomy/Foraminotomy;  Surgeon: Kristeen Miss, MD;  Location: [facility];  Service: Neurosurgery;  Laterality: Bilateral;  Bilateral L3-4 L4-5 L5-S1 Laminotomy/Foraminotomy  . MANDIBLE FRACTURE SURGERY    . MENISCUS REPAIR     Left  . TONSILLECTOMY    . TOTAL KNEE ARTHROPLASTY Right 03/14/2021   Procedure: TOTAL KNEE ARTHROPLASTY;  Surgeon: Paralee Cancel, MD;  [facility];  Service: Orthopedics;  Laterality: Right;  70 mins    There were no vitals filed for this visit.   Subjective Assessment - 04/20/21 0813    Subjective COVID-19 screening performed upon arrival. Doing better with decreased pain    Pertinent History Left knee surgery, back surgery, OA, OA, h/o hiatal hernia, DM, HTN, bee sting allergy, BPH.    How long can you  walk comfortably? Around home.    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    Currently in Pain? Yes    Pain Score 3     Pain Location Knee    Pain Orientation Right    Pain Descriptors / Indicators Discomfort    Pain Type Surgical pain    Pain Onset More than a month ago                             [facility] Adult PT Treatment/Exercise - 04/20/21 0001      Knee/Hip Exercises: Aerobic   Nustep L8-10 x 89min      Knee/Hip Exercises: Standing   Rocker Board 3 minutes   calf stretch     Knee/Hip Exercises: Supine   Short Arc Quad Sets Right;1 set;20 reps   before and after   [employer] Location medial knee    Printmaker Action premod    Printmaker Parameters 1-10hz  x 15  mins    Electrical Stimulation Goals Edema;Pain      Vasopneumatic   Number Minutes Vasopneumatic  10 minutes    Vasopnuematic Location  Knee    Vasopneumatic Pressure Medium    Vasopneumatic Temperature  34 edema/pain      Manual Therapy   Manual Therapy Passive ROM;Soft tissue mobilization    Manual therapy comments patella mobs    Passive ROM manual stretching for right knee ext to improve mobility with contract/ relax techniques                    PT Short Term Goals - 04/10/21 1010      PT SHORT TERM GOAL #1   Title ind with an initial HEP.    Baseline 03/27/21    Time 2    Period Weeks    Status Achieved      PT SHORT TERM GOAL #2   Title Full active right knee extension.    Baseline AROM -9 degrees 04/10/21    Time 2    Period Weeks    Status On-going             PT Long Term Goals - 04/10/21 1010      PT LONG TERM GOAL #1   Title Independent with an advanced HEP.    Time 4    Period Weeks    Status On-going      PT LONG TERM GOAL #2   Title Active right knee flexion to 125  degrees so the patient can perform functional tasks and do so with pain not > 2-3/10.    Time 4    Period Weeks    Status Achieved      PT LONG TERM GOAL #3   Title Increase right knee strength to 5/5 to provide good stability for accomplishment of functional activities.    Period Weeks    Status On-going      PT LONG TERM GOAL #4   Title Perform a reciprocating stair gait with one railing with pain not > 2-3/10.    Time 4    Period Weeks    Status On-going      PT LONG TERM GOAL #5   Title Walk a community distance without assistive device.    Baseline no assist device 04/05/21    Time 4    Period Weeks    Status Achieved                 Plan - 04/20/21 7824    Clinical Impression Statement Pt arrived today doing better with decreased knee pain. Rx focused on Extension ROM using contract/ relax techniques and SAQ before and after stretching. Vaso/esim end of session.    Personal Factors and Comorbidities Comorbidity 1;Comorbidity 2;Other    Comorbidities Left knee surgery, back surgery, OA, OA, h/o hiatal hernia, DM, HTN, bee sting allergy, BPH.    Examination-Activity Limitations Other;Locomotion Level    Examination-Participation Restrictions Other    Rehab Potential Excellent    PT Frequency 3x / week    PT Duration 4 weeks    PT Treatment/Interventions ADLs/Self Care Home Management;Cryotherapy;Electrical Stimulation;Gait training;Stair training;Functional mobility training;Therapeutic activities;Therapeutic exercise;Neuromuscular re-education;Manual techniques;Patient/family education;Passive range of motion;Vasopneumatic Device    PT Next Visit Plan cont with POC for ROM focus on ext and manual STW/modalities PRN           Patient will benefit from skilled therapeutic intervention in order to improve the following deficits and impairments:  Pain,Abnormal gait,Decreased activity tolerance,Decreased range of motion,Decreased strength,Increased edema  Visit  Diagnosis: Chronic pain of right knee  Stiffness of right knee, not elsewhere classified  Localized edema  Muscle weakness (generalized)     Problem List Patient Active Problem List   Diagnosis Date Noted  . Obese 03/15/2021  . Osteoarthritis of right knee 03/14/2021  . Status post total right knee replacement 03/14/2021  . Pre-op evaluation 03/09/2021  . Precordial pain 03/09/2021  . Spinal stenosis of lumbar region 10/28/2017  . DM (diabetes mellitus) type 2, uncontrolled, with ketoacidosis ([place]) 05/03/2014  . HLD (hyperlipidemia) 05/03/2014  . BPH (benign prostatic hyperplasia)   . Need for prophylactic vaccination and inoculation against influenza 10/16/2013  . Other and unspecified hyperlipidemia 03/14/2013  . Essential hypertension 03/14/2013  . Type II or unspecified type diabetes mellitus with unspecified complication, uncontrolled 03/14/2013  . Metabolic syndrome 67/59/1638  . Osteoarthritis of both knees 03/14/2013  . GERD (gastroesophageal reflux disease) 03/14/2013  . ELBOW PAIN 07/13/2008    Haji Delaine,CHRIS , PTA 04/20/2021, 9:40 AM  [facility] [place], [place], [address] Phone: [phone]   Fax:  [phone]  Name: Michael Tapia MRN: [id number] Date of Birth: [birth year]

## 2021-04-25 ENCOUNTER — Other Ambulatory Visit: Payer: Self-pay

## 2021-04-25 ENCOUNTER — Ambulatory Visit: Payer: Medicare Other | Admitting: *Deleted

## 2021-04-25 DIAGNOSIS — M6281 Muscle weakness (generalized): Secondary | ICD-10-CM | POA: Diagnosis not present

## 2021-04-25 DIAGNOSIS — M25661 Stiffness of right knee, not elsewhere classified: Secondary | ICD-10-CM

## 2021-04-25 DIAGNOSIS — R600 Localized edema: Secondary | ICD-10-CM

## 2021-04-25 DIAGNOSIS — I10 Essential (primary) hypertension: Secondary | ICD-10-CM | POA: Diagnosis not present

## 2021-04-25 DIAGNOSIS — E119 Type 2 diabetes mellitus without complications: Secondary | ICD-10-CM | POA: Diagnosis not present

## 2021-04-25 DIAGNOSIS — G8929 Other chronic pain: Secondary | ICD-10-CM

## 2021-04-25 DIAGNOSIS — N400 Enlarged prostate without lower urinary tract symptoms: Secondary | ICD-10-CM | POA: Diagnosis not present

## 2021-04-25 DIAGNOSIS — E785 Hyperlipidemia, unspecified: Secondary | ICD-10-CM | POA: Diagnosis not present

## 2021-04-25 DIAGNOSIS — M25561 Pain in right knee: Secondary | ICD-10-CM | POA: Diagnosis not present

## 2021-04-25 NOTE — Therapy (Signed)
[facility] [place], [place], [address] Phone: [phone]   Fax:  [phone]  Physical Therapy Treatment  Patient Details  Name: Michael Tapia MRN: [id number] Date of Birth: 09-02-1951 Referring Provider (PT): Paralee Cancel MD   Encounter Date: 04/25/2021   PT End of Session - 04/25/21 0950    Visit Number 14    Number of Visits 18    Date for PT Re-Evaluation 05/04/21    Authorization Type FOTO 10th vist score 79  PROGRESS NOTE AT 10TH VISIT.  KX MODIFIER AFTER 15 VISITS.    PT Start Time 0945    PT Stop Time 1035    PT Time Calculation (min) 50 min           Past Medical History:  Diagnosis Date  . Arthritis    knees  . BPH (benign prostatic hyperplasia)   . Diabetes mellitus without complication ([place])    Type II  . GERD (gastroesophageal reflux disease)   . History of hiatal hernia   . Hyperlipidemia   . Hypertension   . Lumbar stenosis with neurogenic claudication   . Metabolic syndrome     Past Surgical History:  Procedure Laterality Date  . BACK SURGERY    . EYE SURGERY     Bilateral Cataracts  . LUMBAR LAMINECTOMY/DECOMPRESSION MICRODISCECTOMY Bilateral 10/28/2017   Procedure: Bilateral L3-4 L4-5 L5-S1 Laminotomy/Foraminotomy;  Surgeon: Kristeen Miss, MD;  Location: [facility];  Service: Neurosurgery;  Laterality: Bilateral;  Bilateral L3-4 L4-5 L5-S1 Laminotomy/Foraminotomy  . MANDIBLE FRACTURE SURGERY    . MENISCUS REPAIR     Left  . TONSILLECTOMY    . TOTAL KNEE ARTHROPLASTY Right 03/14/2021   Procedure: TOTAL KNEE ARTHROPLASTY;  Surgeon: Paralee Cancel, MD;  Location: [facility];  Service: Orthopedics;  Laterality: Right;  70 mins    There were no vitals filed for this visit.   Subjective Assessment - 04/25/21 0948    Subjective COVID-19 screening performed upon arrival. To MD tomorrow. Pain all weekend, but less today    Pertinent History Left knee surgery, back surgery, OA, OA, h/o hiatal  hernia, DM, HTN, bee sting allergy, BPH.    How long can you walk comfortably? Around home.    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    Currently in Pain? Yes    Pain Score 2     Pain Location Knee    Pain Orientation Right    Pain Descriptors / Indicators Discomfort    Pain Type Surgical pain    Pain Onset More than a month ago                             [facility] Adult PT Treatment/Exercise - 04/25/21 0001      Knee/Hip Exercises: Aerobic   Recumbent Bike L4 x 5 mins    Nustep L8-10 x 16min      Knee/Hip Exercises: Standing   Rocker Board 3 minutes   calf stretch     Knee/Hip Exercises: Supine   Short Arc Quad Sets Right;1 set;20 reps   before and after   [employer] Limitations 4#      Modalities   Modalities Vasopneumatic      Vasopneumatic   Number Minutes Vasopneumatic  15 minutes    Vasopnuematic Location  Knee    Vasopneumatic Pressure Medium    Vasopneumatic Temperature  34 edema/pain  Manual Therapy   Manual Therapy Passive ROM;Soft tissue mobilization    Manual therapy comments PROM 6-129degrees, AROM 8-125 degrees    Passive ROM manual stretching for right knee ext to improve mobility with contract/ relax techniques                    PT Short Term Goals - 04/10/21 1010      PT SHORT TERM GOAL #1   Title ind with an initial HEP.    Baseline 03/27/21    Time 2    Period Weeks    Status Achieved      PT SHORT TERM GOAL #2   Title Full active right knee extension.    Baseline AROM -9 degrees 04/10/21    Time 2    Period Weeks    Status On-going             PT Long Term Goals - 04/10/21 1010      PT LONG TERM GOAL #1   Title Independent with an advanced HEP.    Time 4    Period Weeks    Status On-going      PT LONG TERM GOAL #2   Title Active right knee flexion to 125 degrees so the patient can perform functional tasks and do so with pain not > 2-3/10.    Time 4    Period  Weeks    Status Achieved      PT LONG TERM GOAL #3   Title Increase right knee strength to 5/5 to provide good stability for accomplishment of functional activities.    Period Weeks    Status On-going      PT LONG TERM GOAL #4   Title Perform a reciprocating stair gait with one railing with pain not > 2-3/10.    Time 4    Period Weeks    Status On-going      PT LONG TERM GOAL #5   Title Walk a community distance without assistive device.    Baseline no assist device 04/05/21    Time 4    Period Weeks    Status Achieved                 Plan - 04/25/21 1023    Clinical Impression Statement Pt arrived today reporting f/u with MD tomorrow. Rx focused onTKE PROM as well as active. Contract/relax  techniques used to facilitate extension ROM . AROM 8-125 degrees and PROM 6- 129 degrees.    Personal Factors and Comorbidities Comorbidity 1;Comorbidity 2;Other    Comorbidities Left knee surgery, back surgery, OA, OA, h/o hiatal hernia, DM, HTN, bee sting allergy, BPH.    Examination-Activity Limitations Other;Locomotion Level    Examination-Participation Restrictions Other    Stability/Clinical Decision Making Stable/Uncomplicated    Rehab Potential Excellent    PT Frequency 3x / week    PT Duration 4 weeks    PT Treatment/Interventions ADLs/Self Care Home Management;Cryotherapy;Electrical Stimulation;Gait training;Stair training;Functional mobility training;Therapeutic activities;Therapeutic exercise;Neuromuscular re-education;Manual techniques;Patient/family education;Passive range of motion;Vasopneumatic Device    PT Next Visit Plan cont with POC for ROM focus on ext and manual STW/modalities PRN           Patient will benefit from skilled therapeutic intervention in order to improve the following deficits and impairments:  Pain,Abnormal gait,Decreased activity tolerance,Decreased range of motion,Decreased strength,Increased edema  Visit Diagnosis: Chronic pain of right  knee  Stiffness of right knee, not elsewhere classified  Localized edema  Problem List Patient Active Problem List   Diagnosis Date Noted  . Obese 03/15/2021  . Osteoarthritis of right knee 03/14/2021  . Status post total right knee replacement 03/14/2021  . Pre-op evaluation 03/09/2021  . Precordial pain 03/09/2021  . Spinal stenosis of lumbar region 10/28/2017  . DM (diabetes mellitus) type 2, uncontrolled, with ketoacidosis ([place]) 05/03/2014  . HLD (hyperlipidemia) 05/03/2014  . BPH (benign prostatic hyperplasia)   . Need for prophylactic vaccination and inoculation against influenza 10/16/2013  . Other and unspecified hyperlipidemia 03/14/2013  . Essential hypertension 03/14/2013  . Type II or unspecified type diabetes mellitus with unspecified complication, uncontrolled 03/14/2013  . Metabolic syndrome 40/76/8088  . Osteoarthritis of both knees 03/14/2013  . GERD (gastroesophageal reflux disease) 03/14/2013  . ELBOW PAIN 07/13/2008    Bettye Sitton,CHRIS, PTA 04/25/2021, 1:24 PM  [facility] [address] [place], [place], [address] Phone: [phone]   Fax:  [phone]  Name: Michael Tapia MRN: [id number] Date of Birth: [birth year]

## 2021-04-25 NOTE — Therapy (Signed)
[facility] [place], [place], [address] Phone: [phone]   Fax:  [phone]  Physical Therapy Treatment  Patient Details  Name: Michael Tapia MRN: [id number] Date of Birth: [birth year] Referring Provider (PT): Paralee Cancel MD   Encounter Date: 04/25/2021   PT End of Session - 04/25/21 0950    Visit Number 14    Number of Visits 18    Date for PT Re-Evaluation 05/04/21    Authorization Type FOTO 10th vist score 79  PROGRESS NOTE AT 10TH VISIT.  KX MODIFIER AFTER 15 VISITS.    PT Start Time 0945    PT Stop Time 1035    PT Time Calculation (min) 50 min           Past Medical History:  Diagnosis Date  . Arthritis    knees  . BPH (benign prostatic hyperplasia)   . Diabetes mellitus without complication ([place])    Type II  . GERD (gastroesophageal reflux disease)   . History of hiatal hernia   . Hyperlipidemia   . Hypertension   . Lumbar stenosis with neurogenic claudication   . Metabolic syndrome     Past Surgical History:  Procedure Laterality Date  . BACK SURGERY    . EYE SURGERY     Bilateral Cataracts  . LUMBAR LAMINECTOMY/DECOMPRESSION MICRODISCECTOMY Bilateral 10/28/2017   Procedure: Bilateral L3-4 L4-5 L5-S1 Laminotomy/Foraminotomy;  Surgeon: Kristeen Miss, MD;  Location: [facility];  Service: Neurosurgery;  Laterality: Bilateral;  Bilateral L3-4 L4-5 L5-S1 Laminotomy/Foraminotomy  . MANDIBLE FRACTURE SURGERY    . MENISCUS REPAIR     Left  . TONSILLECTOMY    . TOTAL KNEE ARTHROPLASTY Right 03/14/2021   Procedure: TOTAL KNEE ARTHROPLASTY;  Surgeon: Paralee Cancel, MD;  Location: [facility];  Service: Orthopedics;  Laterality: Right;  70 mins    There were no vitals filed for this visit.   Subjective Assessment - 04/25/21 0948    Subjective COVID-19 screening performed upon arrival. To MD tomorrow. Pain all weekend, but less today    Pertinent History Left knee surgery, back surgery, OA, OA, h/o hiatal  hernia, DM, HTN, bee sting allergy, BPH.    How long can you walk comfortably? Around home.    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    Currently in Pain? Yes    Pain Score 2     Pain Location Knee    Pain Orientation Right    Pain Descriptors / Indicators Discomfort    Pain Type Surgical pain    Pain Onset More than a month ago                             [facility] Adult PT Treatment/Exercise - 04/25/21 0001      Knee/Hip Exercises: Aerobic   Recumbent Bike L4 x 5 mins    Nustep L8-10 x 60min      Knee/Hip Exercises: Standing   Rocker Board 3 minutes   calf stretch     Knee/Hip Exercises: Supine   Short Arc Quad Sets Right;1 set;20 reps   before and after   [employer] Limitations 4#      Modalities   Modalities Vasopneumatic      Vasopneumatic   Number Minutes Vasopneumatic  15 minutes    Vasopnuematic Location  Knee    Vasopneumatic Pressure Medium    Vasopneumatic Temperature  34 edema/pain  Manual Therapy   Manual Therapy Passive ROM;Soft tissue mobilization    Manual therapy comments PROM 6-129degrees, AROM 8-125 degrees    Passive ROM manual stretching for right knee ext to improve mobility with contract/ relax techniques                    PT Short Term Goals - 04/10/21 1010      PT SHORT TERM GOAL #1   Title ind with an initial HEP.    Baseline 03/27/21    Time 2    Period Weeks    Status Achieved      PT SHORT TERM GOAL #2   Title Full active right knee extension.    Baseline AROM -9 degrees 04/10/21    Time 2    Period Weeks    Status On-going             PT Long Term Goals - 04/10/21 1010      PT LONG TERM GOAL #1   Title Independent with an advanced HEP.    Time 4    Period Weeks    Status On-going      PT LONG TERM GOAL #2   Title Active right knee flexion to 125 degrees so the patient can perform functional tasks and do so with pain not > 2-3/10.    Time 4    Period  Weeks    Status Achieved      PT LONG TERM GOAL #3   Title Increase right knee strength to 5/5 to provide good stability for accomplishment of functional activities.    Period Weeks    Status On-going      PT LONG TERM GOAL #4   Title Perform a reciprocating stair gait with one railing with pain not > 2-3/10.    Time 4    Period Weeks    Status On-going      PT LONG TERM GOAL #5   Title Walk a community distance without assistive device.    Baseline no assist device 04/05/21    Time 4    Period Weeks    Status Achieved                 Plan - 04/25/21 1023    Clinical Impression Statement Pt arrived today reporting f/u with MD tomorrow. Rx focused onTKE PROM as well as active. Contract/relax  techniques used to facilitate extension ROM . AROM 8-125 degrees and PROM 6- 129 degrees.    Personal Factors and Comorbidities Comorbidity 1;Comorbidity 2;Other    Comorbidities Left knee surgery, back surgery, OA, OA, h/o hiatal hernia, DM, HTN, bee sting allergy, BPH.    Examination-Activity Limitations Other;Locomotion Level    Examination-Participation Restrictions Other    Stability/Clinical Decision Making Stable/Uncomplicated    Rehab Potential Excellent    PT Frequency 3x / week    PT Duration 4 weeks    PT Treatment/Interventions ADLs/Self Care Home Management;Cryotherapy;Electrical Stimulation;Gait training;Stair training;Functional mobility training;Therapeutic activities;Therapeutic exercise;Neuromuscular re-education;Manual techniques;Patient/family education;Passive range of motion;Vasopneumatic Device    PT Next Visit Plan cont with POC for ROM focus on ext and manual STW/modalities PRN           Patient will benefit from skilled therapeutic intervention in order to improve the following deficits and impairments:  Pain,Abnormal gait,Decreased activity tolerance,Decreased range of motion,Decreased strength,Increased edema  Visit Diagnosis: Chronic pain of right  knee  Stiffness of right knee, not elsewhere classified  Localized edema  Problem List Patient Active Problem List   Diagnosis Date Noted  . Obese 03/15/2021  . Osteoarthritis of right knee 03/14/2021  . Status post total right knee replacement 03/14/2021  . Pre-op evaluation 03/09/2021  . Precordial pain 03/09/2021  . Spinal stenosis of lumbar region 10/28/2017  . DM (diabetes mellitus) type 2, uncontrolled, with ketoacidosis ([place]) 05/03/2014  . HLD (hyperlipidemia) 05/03/2014  . BPH (benign prostatic hyperplasia)   . Need for prophylactic vaccination and inoculation against influenza 10/16/2013  . Other and unspecified hyperlipidemia 03/14/2013  . Essential hypertension 03/14/2013  . Type II or unspecified type diabetes mellitus with unspecified complication, uncontrolled 03/14/2013  . Metabolic syndrome 98/33/8250  . Osteoarthritis of both knees 03/14/2013  . GERD (gastroesophageal reflux disease) 03/14/2013  . ELBOW PAIN 07/13/2008    Shomari Matusik,CHRIS, PTA 04/25/2021, 10:40 AM  [facility] [place], [place], [address] Phone: [phone]   Fax:  [phone]  Name: Michael Tapia MRN: [id number] Date of Birth: 06/22/1951

## 2021-04-26 DIAGNOSIS — Z471 Aftercare following joint replacement surgery: Secondary | ICD-10-CM | POA: Diagnosis not present

## 2021-04-26 DIAGNOSIS — Z96651 Presence of right artificial knee joint: Secondary | ICD-10-CM | POA: Diagnosis not present

## 2021-04-27 ENCOUNTER — Other Ambulatory Visit: Payer: Self-pay

## 2021-04-27 ENCOUNTER — Ambulatory Visit: Payer: Medicare Other | Admitting: Physical Therapy

## 2021-04-27 DIAGNOSIS — R600 Localized edema: Secondary | ICD-10-CM | POA: Diagnosis not present

## 2021-04-27 DIAGNOSIS — M6281 Muscle weakness (generalized): Secondary | ICD-10-CM

## 2021-04-27 DIAGNOSIS — G8929 Other chronic pain: Secondary | ICD-10-CM | POA: Diagnosis not present

## 2021-04-27 DIAGNOSIS — M25561 Pain in right knee: Secondary | ICD-10-CM

## 2021-04-27 DIAGNOSIS — M25661 Stiffness of right knee, not elsewhere classified: Secondary | ICD-10-CM | POA: Diagnosis not present

## 2021-04-27 NOTE — Therapy (Signed)
[facility] [place], [place], [address] Phone: [phone]   Fax:  [phone]  Physical Therapy Treatment  Patient Details  Name: Michael Tapia MRN: [id number] Date of Birth: September 07, 1951 Referring Provider (PT): Paralee Cancel MD   Encounter Date: 04/27/2021   PT End of Session - 04/27/21 0955    Visit Number 15    Number of Visits 18    Date for PT Re-Evaluation 05/04/21    Authorization Type FOTO 15th score 99  PROGRESS NOTE AT 10TH VISIT.  KX MODIFIER AFTER 15 VISITS.    PT Start Time 0945    PT Stop Time 1036    PT Time Calculation (min) 51 min    Activity Tolerance Patient tolerated treatment well    Behavior During Therapy WFL for tasks assessed/performed           Past Medical History:  Diagnosis Date  . Arthritis    knees  . BPH (benign prostatic hyperplasia)   . Diabetes mellitus without complication ([place])    Type II  . GERD (gastroesophageal reflux disease)   . History of hiatal hernia   . Hyperlipidemia   . Hypertension   . Lumbar stenosis with neurogenic claudication   . Metabolic syndrome     Past Surgical History:  Procedure Laterality Date  . BACK SURGERY    . EYE SURGERY     Bilateral Cataracts  . LUMBAR LAMINECTOMY/DECOMPRESSION MICRODISCECTOMY Bilateral 10/28/2017   Procedure: Bilateral L3-4 L4-5 L5-S1 Laminotomy/Foraminotomy;  Surgeon: Kristeen Miss, MD;  Location: [facility];  Service: Neurosurgery;  Laterality: Bilateral;  Bilateral L3-4 L4-5 L5-S1 Laminotomy/Foraminotomy  . MANDIBLE FRACTURE SURGERY    . MENISCUS REPAIR     Left  . TONSILLECTOMY    . TOTAL KNEE ARTHROPLASTY Right 03/14/2021   Procedure: TOTAL KNEE ARTHROPLASTY;  Surgeon: Paralee Cancel, MD;  Location[facility];  Service: Orthopedics;  Laterality: Right;  70 mins    There were no vitals filed for this visit.   Subjective Assessment - 04/27/21 0952    Subjective COVID-19 screening performed upon arrival. Patient went to  MD and can DC therapy when ready and the medial pain in knee is normal due to nerve irritation and to treat with pain patch    Pertinent History Left knee surgery, back surgery, OA, OA, h/o hiatal hernia, DM, HTN, bee sting allergy, BPH.    How long can you walk comfortably? Around home.    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    Currently in Pain? Yes    Pain Score 2     Pain Location Knee    Pain Orientation Right    Pain Descriptors / Indicators Discomfort    Pain Type Surgical pain    Pain Onset More than a month ago    Pain Frequency Intermittent    Aggravating Factors  prolong walking in medical knee    Pain Relieving Factors pain patch/cream              [facility] PT Assessment - 04/27/21 0001      ROM / Strength   AROM / PROM / Strength AROM;PROM;Strength      AROM   AROM Assessment Site Knee    Right/Left Knee Right    Right Knee Extension -6    Right Knee Flexion 125      PROM   PROM Assessment Site Knee    Right/Left Knee Right    Right Knee Extension -  2      Strength   Strength Assessment Site Knee    Right/Left Knee Right    Right Knee Flexion 5/5    Right Knee Extension 5/5                         [facility] Adult PT Treatment/Exercise - 04/27/21 0001      Knee/Hip Exercises: Aerobic   Recumbent Bike L4 x108mn    Nustep L8 x524m      Knee/Hip Exercises: Machines for Strengthening   Cybex Knee Extension 20# 3x10    Cybex Knee Flexion 30# 3x10    Cybex Leg Press 2 1/2plt 3x10      Vasopneumatic   Number Minutes Vasopneumatic  15 minutes    Vasopnuematic Location  Knee    Vasopneumatic Pressure Medium    Vasopneumatic Temperature  34 edema/pain      Manual Therapy   Manual Therapy Passive ROM;Soft tissue mobilization    Passive ROM manual stretching for right knee ext to improve mobility                    PT Short Term Goals - 04/27/21 1001      PT SHORT TERM GOAL #1   Title ind with an initial  HEP.    Baseline 03/27/21    Time 2    Period Weeks    Status Achieved      PT SHORT TERM GOAL #2   Title Full active right knee extension.    Baseline AROM -6 degrees 04/27/21    Time 2    Period Weeks    Status On-going             PT Long Term Goals - 04/27/21 1001      PT LONG TERM GOAL #1   Title Independent with an advanced HEP.    Baseline met 04/27/21 Doing self stretches and home routine per reported    Time 4    Period Weeks    Status Achieved      PT LONG TERM GOAL #2   Title Active right knee flexion to 125 degrees so the patient can perform functional tasks and do so with pain not > 2-3/10.    Baseline AROM 125 degrees 04/27/21    Time 4    Period Weeks    Status Achieved      PT LONG TERM GOAL #3   Title Increase right knee strength to 5/5 to provide good stability for accomplishment of functional activities.    Baseline Met 04/27/21    Time 6    Period Weeks    Status Achieved      PT LONG TERM GOAL #4   Title Perform a reciprocating stair gait with one railing with pain not > 2-3/10.    Baseline Met 04/27/21    Time 4    Period Weeks    Status Achieved      PT LONG TERM GOAL #5   Title Walk a community distance without assistive device.    Baseline no assist device 04/05/21    Time 4    Period Weeks    Status Achieved                 Plan - 04/27/21 1028    Clinical Impression Statement Patient tolerated treatment well today. Patient has continued to progress with all activities today. Patient improved LE strength, ROM and all functional activities.  Patient doing his daily stretches and exercise routine with no difficulty. Patient met all current goals except ext. Patient to DC next visit and approved by MD.    Personal Factors and Comorbidities Comorbidity 1;Comorbidity 2;Other    Comorbidities Left knee surgery, back surgery, OA, OA, h/o hiatal hernia, DM, HTN, bee sting allergy, BPH.    Examination-Activity Limitations Other;Locomotion  Level    Examination-Participation Restrictions Other    Stability/Clinical Decision Making Stable/Uncomplicated    Rehab Potential Excellent    PT Frequency 3x / week    PT Duration 4 weeks    PT Treatment/Interventions ADLs/Self Care Home Management;Cryotherapy;Electrical Stimulation;Gait training;Stair training;Functional mobility training;Therapeutic activities;Therapeutic exercise;Neuromuscular re-education;Manual techniques;Patient/family education;Passive range of motion;Vasopneumatic Device    PT Next Visit Plan cont with POC and DC next visit    Consulted and Agree with Plan of Care Patient           Patient will benefit from skilled therapeutic intervention in order to improve the following deficits and impairments:  Pain,Abnormal gait,Decreased activity tolerance,Decreased range of motion,Decreased strength,Increased edema  Visit Diagnosis: Chronic pain of right knee  Stiffness of right knee, not elsewhere classified  Localized edema  Muscle weakness (generalized)     Problem List Patient Active Problem List   Diagnosis Date Noted  . Obese 03/15/2021  . Osteoarthritis of right knee 03/14/2021  . Status post total right knee replacement 03/14/2021  . Pre-op evaluation 03/09/2021  . Precordial pain 03/09/2021  . Spinal stenosis of lumbar region 10/28/2017  . DM (diabetes mellitus) type 2, uncontrolled, with ketoacidosis ([place]) 05/03/2014  . HLD (hyperlipidemia) 05/03/2014  . BPH (benign prostatic hyperplasia)   . Need for prophylactic vaccination and inoculation against influenza 10/16/2013  . Other and unspecified hyperlipidemia 03/14/2013  . Essential hypertension 03/14/2013  . Type II or unspecified type diabetes mellitus with unspecified complication, uncontrolled 03/14/2013  . Metabolic syndrome 57/01/7792  . Osteoarthritis of both knees 03/14/2013  . GERD (gastroesophageal reflux disease) 03/14/2013  . ELBOW PAIN 07/13/2008    Phillips Climes,  PTA 04/27/2021, 10:40 AM  [facility] [place], [place], [address] Phone: [phone]   Fax:  [phone]  Name: Michael Tapia MRN: [id number] Date of Birth: 02-26-1951

## 2021-05-03 ENCOUNTER — Other Ambulatory Visit: Payer: Self-pay

## 2021-05-03 ENCOUNTER — Ambulatory Visit: Payer: Medicare Other | Attending: Orthopedic Surgery | Admitting: Physical Therapy

## 2021-05-03 DIAGNOSIS — M6281 Muscle weakness (generalized): Secondary | ICD-10-CM | POA: Insufficient documentation

## 2021-05-03 DIAGNOSIS — M25661 Stiffness of right knee, not elsewhere classified: Secondary | ICD-10-CM | POA: Diagnosis not present

## 2021-05-03 DIAGNOSIS — M25561 Pain in right knee: Secondary | ICD-10-CM | POA: Insufficient documentation

## 2021-05-03 DIAGNOSIS — R600 Localized edema: Secondary | ICD-10-CM | POA: Diagnosis not present

## 2021-05-03 DIAGNOSIS — G8929 Other chronic pain: Secondary | ICD-10-CM | POA: Diagnosis not present

## 2021-05-03 NOTE — Therapy (Signed)
[facility] [place], [place], [address] Phone: [phone]   Fax:  [phone]  Physical Therapy Treatment  Patient Details  Name: Michael Tapia MRN: [id number] Date of Birth: Jun 01, 1951 Referring Provider (PT): Paralee Cancel MD   Encounter Date: 05/03/2021   PT End of Session - 05/03/21 0952    Visit Number 16    Number of Visits 18    Date for PT Re-Evaluation 05/04/21    Authorization Type FOTO 15th score 63  PROGRESS NOTE AT 10TH VISIT.  KX MODIFIER AFTER 15 VISITS.    PT Start Time 0945    PT Stop Time 1026    PT Time Calculation (min) 41 min    Activity Tolerance Patient tolerated treatment well    Behavior During Therapy WFL for tasks assessed/performed           Past Medical History:  Diagnosis Date  . Arthritis    knees  . BPH (benign prostatic hyperplasia)   . Diabetes mellitus without complication ([place])    Type II  . GERD (gastroesophageal reflux disease)   . History of hiatal hernia   . Hyperlipidemia   . Hypertension   . Lumbar stenosis with neurogenic claudication   . Metabolic syndrome     Past Surgical History:  Procedure Laterality Date  . BACK SURGERY    . EYE SURGERY     Bilateral Cataracts  . LUMBAR LAMINECTOMY/DECOMPRESSION MICRODISCECTOMY Bilateral 10/28/2017   Procedure: Bilateral L3-4 L4-5 L5-S1 Laminotomy/Foraminotomy;  Surgeon: Kristeen Miss, MD;  Location: [facility];  Service: Neurosurgery;  Laterality: Bilateral;  Bilateral L3-4 L4-5 L5-S1 Laminotomy/Foraminotomy  . MANDIBLE FRACTURE SURGERY    . MENISCUS REPAIR     Left  . TONSILLECTOMY    . TOTAL KNEE ARTHROPLASTY Right 03/14/2021   Procedure: TOTAL KNEE ARTHROPLASTY;  Surgeon: Paralee Cancel, MD;  Location: [facility];  Service: Orthopedics;  Laterality: Right;  70 mins    There were no vitals filed for this visit.   Subjective Assessment - 05/03/21 0950    Subjective COVID-19 screening performed upon arrival. Patient arrived  doing well today.    Pertinent History Left knee surgery, back surgery, OA, OA, h/o hiatal hernia, DM, HTN, bee sting allergy, BPH.    How long can you walk comfortably? Around home.    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    Currently in Pain? Yes    Pain Score 1     Pain Location Knee    Pain Orientation Right    Pain Descriptors / Indicators Discomfort    Pain Type Surgical pain    Pain Onset More than a month ago    Pain Frequency Intermittent    Aggravating Factors  certain activity    Pain Relieving Factors pain patch/cream              [facility] PT Assessment - 05/03/21 0001      AROM   AROM Assessment Site Knee    Right/Left Knee Right    Right Knee Extension -6                         [facility] Adult PT Treatment/Exercise - 05/03/21 0001      Knee/Hip Exercises: Aerobic   Recumbent Bike L4 x69min    Nustep L8 x35min      Knee/Hip Exercises: Machines for Strengthening   Cybex Knee Extension 20# 3x10    Cybex Knee  Flexion 30# 3x10    Cybex Leg Press 2 1/2plt 3x10      Vasopneumatic   Number Minutes Vasopneumatic  15 minutes    Vasopnuematic Location  Knee    Vasopneumatic Pressure Medium    Vasopneumatic Temperature  34 edema/pain      Manual Therapy   Manual Therapy Passive ROM;Soft tissue mobilization    Passive ROM manual stretching for right knee ext to improve mobility                    PT Short Term Goals - 05/03/21 0953      PT SHORT TERM GOAL #1   Title ind with an initial HEP.    Baseline 03/27/21    Time 2    Period Weeks    Status Achieved      PT SHORT TERM GOAL #2   Title Full active right knee extension.    Baseline AROM -6 degrees 05/03/21    Time 2    Period Weeks    Status Not Met             PT Long Term Goals - 04/27/21 1001      PT LONG TERM GOAL #1   Title Independent with an advanced HEP.    Baseline met 04/27/21 Doing self stretches and home routine per reported    Time 4     Period Weeks    Status Achieved      PT LONG TERM GOAL #2   Title Active right knee flexion to 125 degrees so the patient can perform functional tasks and do so with pain not > 2-3/10.    Baseline AROM 125 degrees 04/27/21    Time 4    Period Weeks    Status Achieved      PT LONG TERM GOAL #3   Title Increase right knee strength to 5/5 to provide good stability for accomplishment of functional activities.    Baseline Met 04/27/21    Time 6    Period Weeks    Status Achieved      PT LONG TERM GOAL #4   Title Perform a reciprocating stair gait with one railing with pain not > 2-3/10.    Baseline Met 04/27/21    Time 4    Period Weeks    Status Achieved      PT LONG TERM GOAL #5   Title Walk a community distance without assistive device.    Baseline no assist device 04/05/21    Time 4    Period Weeks    Status Achieved                 Plan - 05/03/21 1003    Clinical Impression Statement Patient has met all current goals except full active ext to -6 degrees. Patient is back to all ADL's with little discomfort. Patient joined a gym and will maintain his home program. DC today.    Personal Factors and Comorbidities Comorbidity 1;Comorbidity 2;Other    Comorbidities Left knee surgery, back surgery, OA, OA, h/o hiatal hernia, DM, HTN, bee sting allergy, BPH.    Examination-Activity Limitations Other;Locomotion Level    Examination-Participation Restrictions Other    Stability/Clinical Decision Making Stable/Uncomplicated    Rehab Potential Excellent    PT Frequency 3x / week    PT Duration 4 weeks    PT Treatment/Interventions ADLs/Self Care Home Management;Cryotherapy;Electrical Stimulation;Gait training;Stair training;Functional mobility training;Therapeutic activities;Therapeutic exercise;Neuromuscular re-education;Manual techniques;Patient/family education;Passive range of motion;Vasopneumatic Device  PT Next Visit Plan DC    Consulted and Agree with Plan of Care  Patient           Patient will benefit from skilled therapeutic intervention in order to improve the following deficits and impairments:  Pain,Abnormal gait,Decreased activity tolerance,Decreased range of motion,Decreased strength,Increased edema  Visit Diagnosis: Chronic pain of right knee  Stiffness of right knee, not elsewhere classified  Localized edema  Muscle weakness (generalized)     Problem List Patient Active Problem List   Diagnosis Date Noted  . Obese 03/15/2021  . Osteoarthritis of right knee 03/14/2021  . Status post total right knee replacement 03/14/2021  . Pre-op evaluation 03/09/2021  . Precordial pain 03/09/2021  . Spinal stenosis of lumbar region 10/28/2017  . DM (diabetes mellitus) type 2, uncontrolled, with ketoacidosis ([place]) 05/03/2014  . HLD (hyperlipidemia) 05/03/2014  . BPH (benign prostatic hyperplasia)   . Need for prophylactic vaccination and inoculation against influenza 10/16/2013  . Other and unspecified hyperlipidemia 03/14/2013  . Essential hypertension 03/14/2013  . Type II or unspecified type diabetes mellitus with unspecified complication, uncontrolled 03/14/2013  . Metabolic syndrome 54/56/2563  . Osteoarthritis of both knees 03/14/2013  . GERD (gastroesophageal reflux disease) 03/14/2013  . ELBOW PAIN 07/13/2008    Ladean Raya, PTA 05/03/21 10:27 AM  [facility] [place], [place], [address] Phone: [phone]   Fax:  [phone]  Name: Michael Tapia MRN: [id number] Date of Birth: 09/06/1951  PHYSICAL THERAPY DISCHARGE SUMMARY  Visits from Start of Care: 16.  Current functional level related to goals / functional outcomes: See above.   Remaining deficits: All goals met.   Education / Equipment: HEP. Plan: Patient agrees to discharge.  Patient goals were met. Patient is being discharged due to meeting the stated rehab goals.  ?????           Mali Applegate MPT

## 2021-05-11 DIAGNOSIS — I10 Essential (primary) hypertension: Secondary | ICD-10-CM | POA: Diagnosis not present

## 2021-05-11 DIAGNOSIS — G8929 Other chronic pain: Secondary | ICD-10-CM | POA: Diagnosis not present

## 2021-05-11 DIAGNOSIS — E785 Hyperlipidemia, unspecified: Secondary | ICD-10-CM | POA: Diagnosis not present

## 2021-05-11 DIAGNOSIS — E119 Type 2 diabetes mellitus without complications: Secondary | ICD-10-CM | POA: Diagnosis not present

## 2021-05-11 DIAGNOSIS — Z96651 Presence of right artificial knee joint: Secondary | ICD-10-CM | POA: Diagnosis not present

## 2021-05-11 DIAGNOSIS — N400 Enlarged prostate without lower urinary tract symptoms: Secondary | ICD-10-CM | POA: Diagnosis not present

## 2021-06-22 DIAGNOSIS — E119 Type 2 diabetes mellitus without complications: Secondary | ICD-10-CM | POA: Diagnosis not present

## 2021-06-22 DIAGNOSIS — G8929 Other chronic pain: Secondary | ICD-10-CM | POA: Diagnosis not present

## 2021-06-22 DIAGNOSIS — N400 Enlarged prostate without lower urinary tract symptoms: Secondary | ICD-10-CM | POA: Diagnosis not present

## 2021-06-22 DIAGNOSIS — E785 Hyperlipidemia, unspecified: Secondary | ICD-10-CM | POA: Diagnosis not present

## 2021-06-22 DIAGNOSIS — I10 Essential (primary) hypertension: Secondary | ICD-10-CM | POA: Diagnosis not present

## 2021-07-12 DIAGNOSIS — D123 Benign neoplasm of transverse colon: Secondary | ICD-10-CM | POA: Diagnosis not present

## 2021-07-12 DIAGNOSIS — K5730 Diverticulosis of large intestine without perforation or abscess without bleeding: Secondary | ICD-10-CM | POA: Diagnosis not present

## 2021-07-12 DIAGNOSIS — K621 Rectal polyp: Secondary | ICD-10-CM | POA: Diagnosis not present

## 2021-07-12 DIAGNOSIS — D122 Benign neoplasm of ascending colon: Secondary | ICD-10-CM | POA: Diagnosis not present

## 2021-07-12 DIAGNOSIS — D120 Benign neoplasm of cecum: Secondary | ICD-10-CM | POA: Diagnosis not present

## 2021-07-12 DIAGNOSIS — K635 Polyp of colon: Secondary | ICD-10-CM | POA: Diagnosis not present

## 2021-07-12 DIAGNOSIS — Z1211 Encounter for screening for malignant neoplasm of colon: Secondary | ICD-10-CM | POA: Diagnosis not present

## 2021-07-14 DIAGNOSIS — K635 Polyp of colon: Secondary | ICD-10-CM | POA: Diagnosis not present

## 2021-07-14 DIAGNOSIS — D122 Benign neoplasm of ascending colon: Secondary | ICD-10-CM | POA: Diagnosis not present

## 2021-07-14 DIAGNOSIS — D123 Benign neoplasm of transverse colon: Secondary | ICD-10-CM | POA: Diagnosis not present

## 2021-07-14 DIAGNOSIS — K621 Rectal polyp: Secondary | ICD-10-CM | POA: Diagnosis not present

## 2021-07-14 DIAGNOSIS — D120 Benign neoplasm of cecum: Secondary | ICD-10-CM | POA: Diagnosis not present

## 2021-08-21 DIAGNOSIS — G8929 Other chronic pain: Secondary | ICD-10-CM | POA: Diagnosis not present

## 2021-08-21 DIAGNOSIS — I10 Essential (primary) hypertension: Secondary | ICD-10-CM | POA: Diagnosis not present

## 2021-08-21 DIAGNOSIS — N400 Enlarged prostate without lower urinary tract symptoms: Secondary | ICD-10-CM | POA: Diagnosis not present

## 2021-08-21 DIAGNOSIS — E119 Type 2 diabetes mellitus without complications: Secondary | ICD-10-CM | POA: Diagnosis not present

## 2021-08-21 DIAGNOSIS — E785 Hyperlipidemia, unspecified: Secondary | ICD-10-CM | POA: Diagnosis not present

## 2021-08-24 DIAGNOSIS — M7022 Olecranon bursitis, left elbow: Secondary | ICD-10-CM | POA: Diagnosis not present

## 2021-08-24 DIAGNOSIS — M25521 Pain in right elbow: Secondary | ICD-10-CM | POA: Diagnosis not present

## 2021-09-08 DIAGNOSIS — M25521 Pain in right elbow: Secondary | ICD-10-CM | POA: Diagnosis not present

## 2021-09-18 DIAGNOSIS — I10 Essential (primary) hypertension: Secondary | ICD-10-CM | POA: Diagnosis not present

## 2021-09-18 DIAGNOSIS — E119 Type 2 diabetes mellitus without complications: Secondary | ICD-10-CM | POA: Diagnosis not present

## 2021-09-18 DIAGNOSIS — G8929 Other chronic pain: Secondary | ICD-10-CM | POA: Diagnosis not present

## 2021-09-18 DIAGNOSIS — E785 Hyperlipidemia, unspecified: Secondary | ICD-10-CM | POA: Diagnosis not present

## 2021-09-18 DIAGNOSIS — N400 Enlarged prostate without lower urinary tract symptoms: Secondary | ICD-10-CM | POA: Diagnosis not present

## 2021-09-21 DIAGNOSIS — I10 Essential (primary) hypertension: Secondary | ICD-10-CM | POA: Diagnosis not present

## 2021-09-21 DIAGNOSIS — M25521 Pain in right elbow: Secondary | ICD-10-CM | POA: Diagnosis not present

## 2021-09-21 DIAGNOSIS — E785 Hyperlipidemia, unspecified: Secondary | ICD-10-CM | POA: Diagnosis not present

## 2021-09-21 DIAGNOSIS — Z23 Encounter for immunization: Secondary | ICD-10-CM | POA: Diagnosis not present

## 2021-09-21 DIAGNOSIS — G8929 Other chronic pain: Secondary | ICD-10-CM | POA: Diagnosis not present

## 2021-09-21 DIAGNOSIS — E119 Type 2 diabetes mellitus without complications: Secondary | ICD-10-CM | POA: Diagnosis not present

## 2021-09-21 DIAGNOSIS — N400 Enlarged prostate without lower urinary tract symptoms: Secondary | ICD-10-CM | POA: Diagnosis not present

## 2021-09-29 DIAGNOSIS — M19021 Primary osteoarthritis, right elbow: Secondary | ICD-10-CM | POA: Diagnosis not present

## 2021-10-03 ENCOUNTER — Encounter: Payer: Self-pay | Admitting: Dermatology

## 2021-10-03 ENCOUNTER — Ambulatory Visit (INDEPENDENT_AMBULATORY_CARE_PROVIDER_SITE_OTHER): Payer: Medicare Other | Admitting: Dermatology

## 2021-10-03 ENCOUNTER — Other Ambulatory Visit: Payer: Self-pay

## 2021-10-03 DIAGNOSIS — L821 Other seborrheic keratosis: Secondary | ICD-10-CM

## 2021-10-03 DIAGNOSIS — D485 Neoplasm of uncertain behavior of skin: Secondary | ICD-10-CM

## 2021-10-03 DIAGNOSIS — L820 Inflamed seborrheic keratosis: Secondary | ICD-10-CM

## 2021-10-03 DIAGNOSIS — L570 Actinic keratosis: Secondary | ICD-10-CM

## 2021-10-03 DIAGNOSIS — Z1283 Encounter for screening for malignant neoplasm of skin: Secondary | ICD-10-CM

## 2021-10-03 MED ORDER — TOLAK 4 % EX CREA: TOPICAL_CREAM | CUTANEOUS | 1 refills | Status: DC

## 2021-10-03 NOTE — Patient Instructions (Signed)

## 2021-10-11 DIAGNOSIS — N400 Enlarged prostate without lower urinary tract symptoms: Secondary | ICD-10-CM | POA: Diagnosis not present

## 2021-10-11 DIAGNOSIS — G8929 Other chronic pain: Secondary | ICD-10-CM | POA: Diagnosis not present

## 2021-10-11 DIAGNOSIS — E785 Hyperlipidemia, unspecified: Secondary | ICD-10-CM | POA: Diagnosis not present

## 2021-10-11 DIAGNOSIS — E119 Type 2 diabetes mellitus without complications: Secondary | ICD-10-CM | POA: Diagnosis not present

## 2021-10-11 DIAGNOSIS — I10 Essential (primary) hypertension: Secondary | ICD-10-CM | POA: Diagnosis not present

## 2021-10-18 ENCOUNTER — Encounter: Payer: Self-pay | Admitting: Dermatology

## 2021-10-18 NOTE — Addendum Note (Signed)
Addended by: Lavonna Monarch on: 10/18/2021 05:16 AM   Modules accepted: Level of Service

## 2021-10-18 NOTE — Progress Notes (Signed)
   Follow-Up Visit   Subjective  Fountain Derusha is a 70 y.o. male who presents for the following: Skin Problem (Face- scaly or rough).  Crust on face and possibly on back Location:  Duration:  Quality:  Associated Signs/Symptoms: Modifying Factors:  Severity:  Timing: Context:   Objective  Well appearing patient in no apparent distress; mood and affect are within normal limits. Torso - Posterior (Back) Waist up skin examination: No atypical pigmented lesions.  1 possible nonmelanoma skin cancer right scapula will be biopsied.  Left Lower Back 6 mm textured brown flattopped papule  Right Upper Back Hornlike 6 mm crust, rule out SCCA       Right Malar Cheek (15) Multiple small gritty crusts.  1 thicker lesion right cheek       All skin waist up examined.   Assessment & Plan    Encounter for screening for malignant neoplasm of skin Torso - Posterior (Back)  Annual skin examination.  Seborrheic keratosis Left Lower Back  Leave if stable  Neoplasm of uncertain behavior of skin Right Upper Back  Skin / nail biopsy Type of biopsy: tangential   Informed consent: discussed and consent obtained   Timeout: patient name, date of birth, surgical site, and procedure verified   Procedure prep:  Patient was prepped and draped in usual sterile fashion (Non sterile) Prep type:  Chlorhexidine Anesthesia: the lesion was anesthetized in a standard fashion   Anesthetic:  1% lidocaine w/ epinephrine 1-100,000 local infiltration Instrument used: flexible razor blade   Outcome: patient tolerated procedure well   Post-procedure details: wound care instructions given    Destruction of lesion Complexity: simple   Destruction method: electrodesiccation and curettage   Informed consent: discussed and consent obtained   Timeout:  patient name, date of birth, surgical site, and procedure verified Anesthesia: the lesion was anesthetized in a standard fashion   Anesthetic:   1% lidocaine w/ epinephrine 1-100,000 local infiltration Curettage performed in three different directions: Yes   Curettage cycles:  1 Margin per side (cm):  0.1 Final wound size (cm):  1 Hemostasis achieved with:  aluminum chloride Outcome: patient tolerated procedure well with no complications   Post-procedure details: wound care instructions given    Specimen 1 - Surgical pathology Differential Diagnosis: bcc vs scc- txpbx  Check Margins: No  AK (actinic keratosis) (15) Right Malar Cheek  LN2 to the lesion right cheek.  For the more diffuse involvement: PDT vs Tolak cream: Discussed these in some detail and can decide in the next few months which he would prefer.  Destruction of lesion - Right Malar Cheek Complexity: simple   Destruction method: cryotherapy   Informed consent: discussed and consent obtained   Timeout:  patient name, date of birth, surgical site, and procedure verified Lesion destroyed using liquid nitrogen: Yes   Cryotherapy cycles:  3 Outcome: patient tolerated procedure well with no complications    Fluorouracil (TOLAK) 4 % CREA - Right Malar Cheek Apply to affected qhs 24-26 applications      I, Lavonna Monarch, MD, have reviewed all documentation for this visit.  The documentation on 10/18/21 for the exam, diagnosis, procedures, and orders are all accurate and complete.

## 2021-12-13 DIAGNOSIS — Z0000 Encounter for general adult medical examination without abnormal findings: Secondary | ICD-10-CM | POA: Diagnosis not present

## 2021-12-13 DIAGNOSIS — Z1389 Encounter for screening for other disorder: Secondary | ICD-10-CM | POA: Diagnosis not present

## 2022-01-08 ENCOUNTER — Ambulatory Visit (INDEPENDENT_AMBULATORY_CARE_PROVIDER_SITE_OTHER): Payer: Medicare Other | Admitting: Dermatology

## 2022-01-08 ENCOUNTER — Other Ambulatory Visit: Payer: Self-pay

## 2022-01-08 DIAGNOSIS — L570 Actinic keratosis: Secondary | ICD-10-CM | POA: Diagnosis not present

## 2022-01-08 MED ORDER — AMINOLEVULINIC ACID HCL 10 % EX GEL
2000.0000 mg | Freq: Once | CUTANEOUS | Status: AC
  Administered 2022-01-08: 2000 mg via TOPICAL

## 2022-01-08 NOTE — Patient Instructions (Signed)

## 2022-01-23 DIAGNOSIS — Z125 Encounter for screening for malignant neoplasm of prostate: Secondary | ICD-10-CM | POA: Diagnosis not present

## 2022-01-23 DIAGNOSIS — I10 Essential (primary) hypertension: Secondary | ICD-10-CM | POA: Diagnosis not present

## 2022-01-23 DIAGNOSIS — E119 Type 2 diabetes mellitus without complications: Secondary | ICD-10-CM | POA: Diagnosis not present

## 2022-01-23 DIAGNOSIS — E785 Hyperlipidemia, unspecified: Secondary | ICD-10-CM | POA: Diagnosis not present

## 2022-01-23 DIAGNOSIS — N400 Enlarged prostate without lower urinary tract symptoms: Secondary | ICD-10-CM | POA: Diagnosis not present

## 2022-01-23 DIAGNOSIS — G8929 Other chronic pain: Secondary | ICD-10-CM | POA: Diagnosis not present

## 2022-01-30 ENCOUNTER — Encounter: Payer: Self-pay | Admitting: Dermatology

## 2022-01-30 NOTE — Progress Notes (Signed)
° °  Follow-Up Visit   Subjective  Michael Tapia is a 71 y.o. male who presents for the following: Photodynamic Therapy (Here for PDT. ).  Diffuse and hypertrophic actinic keratoses Location:  Duration:  Quality:  Associated Signs/Symptoms: Modifying Factors:  Severity:  Timing: Context:   Objective  Well appearing patient in no apparent distress; mood and affect are within normal limits. face/scalp Diffuse actinic keratoses scalp and upper face.   Previous- 2016 scalp PDT Patient has tolak at home and will defer treatment and do pdt    A focused examination was performed including head and neck. Relevant physical exam findings are noted in the Assessment and Plan.   Assessment & Plan    AK (actinic keratosis) face/scalp  Reviewed essentially all treatment options.  We will proceed with red light PDT after debridement of hypertrophic lesions.  Photodynamic therapy - face/scalp Procedure discussed: discussed risks, benefits, side effects. and alternatives   Prep: site scrubbed/prepped with acetone   Debridement needed: Yes   Location:  Scalp/face Type of treatment:  Red light Aminolevulinic Acid (see MAR for details): Ameluz Amount of Ameluz (mg):  2000 Incubation time (minutes):  90 Number of minutes under lamp:  30 Cooling:  Fan and floor fan Outcome: patient tolerated procedure well with no complications   Post-procedure details: sunscreen applied and aftercare instructions given to patient    Aminolevulinic Acid HCl 10 % GEL 2,000 mg - face/scalp       I, Lavonna Monarch, MD, have reviewed all documentation for this visit.  The documentation on 01/30/22 for the exam, diagnosis, procedures, and orders are all accurate and complete.

## 2022-02-22 DIAGNOSIS — E785 Hyperlipidemia, unspecified: Secondary | ICD-10-CM | POA: Diagnosis not present

## 2022-02-22 DIAGNOSIS — E119 Type 2 diabetes mellitus without complications: Secondary | ICD-10-CM | POA: Diagnosis not present

## 2022-02-22 DIAGNOSIS — I10 Essential (primary) hypertension: Secondary | ICD-10-CM | POA: Diagnosis not present

## 2022-03-22 DIAGNOSIS — M7989 Other specified soft tissue disorders: Secondary | ICD-10-CM | POA: Diagnosis not present

## 2022-04-03 ENCOUNTER — Encounter: Payer: Self-pay | Admitting: Dermatology

## 2022-04-03 ENCOUNTER — Ambulatory Visit (INDEPENDENT_AMBULATORY_CARE_PROVIDER_SITE_OTHER): Payer: Medicare Other | Admitting: Dermatology

## 2022-04-03 DIAGNOSIS — L570 Actinic keratosis: Secondary | ICD-10-CM | POA: Diagnosis not present

## 2022-04-09 DIAGNOSIS — E119 Type 2 diabetes mellitus without complications: Secondary | ICD-10-CM | POA: Diagnosis not present

## 2022-04-09 DIAGNOSIS — I10 Essential (primary) hypertension: Secondary | ICD-10-CM | POA: Diagnosis not present

## 2022-04-09 DIAGNOSIS — G8929 Other chronic pain: Secondary | ICD-10-CM | POA: Diagnosis not present

## 2022-04-09 DIAGNOSIS — N400 Enlarged prostate without lower urinary tract symptoms: Secondary | ICD-10-CM | POA: Diagnosis not present

## 2022-04-09 DIAGNOSIS — E785 Hyperlipidemia, unspecified: Secondary | ICD-10-CM | POA: Diagnosis not present

## 2022-04-22 ENCOUNTER — Encounter: Payer: Self-pay | Admitting: Dermatology

## 2022-04-22 NOTE — Progress Notes (Signed)
? ?  Follow-Up Visit ?  ?Subjective  ?Michael Tapia is a 71 y.o. male who presents for the following: Follow-up (12 week follow up pdt positive reaction). ? ?Actinic keratoses, follow-up on PDT ?Location:  ?Duration:  ?Quality:  ?Associated Signs/Symptoms: ?Modifying Factors:  ?Severity:  ?Timing: ?Context:  ? ?Objective  ?Well appearing patient in no apparent distress; mood and affect are within normal limits. ?Mid Frontal Scalp ?Post PDT 12 weeks red light positive reaction. Scalp and forehead small crusty lesions still present, perhaps 60% overall improvement. ? ? ? ?A focused examination was performed including head and neck.. Relevant physical exam findings are noted in the Assessment and Plan. ? ? ?Assessment & Plan  ? ? ?AK (actinic keratosis) ?Mid Frontal Scalp ? ?Defer any treatment today I will contact me in the late fall or winter to review options. ? ? ? ? ? ?I, Lavonna Monarch, MD, have reviewed all documentation for this visit.  The documentation on 04/22/22 for the exam, diagnosis, procedures, and orders are all accurate and complete. ?

## 2022-08-30 DIAGNOSIS — E7800 Pure hypercholesterolemia, unspecified: Secondary | ICD-10-CM | POA: Diagnosis not present

## 2022-08-30 DIAGNOSIS — E1169 Type 2 diabetes mellitus with other specified complication: Secondary | ICD-10-CM | POA: Diagnosis not present

## 2022-08-30 DIAGNOSIS — Z0000 Encounter for general adult medical examination without abnormal findings: Secondary | ICD-10-CM | POA: Diagnosis not present

## 2022-08-30 DIAGNOSIS — K219 Gastro-esophageal reflux disease without esophagitis: Secondary | ICD-10-CM | POA: Diagnosis not present

## 2022-08-30 DIAGNOSIS — N400 Enlarged prostate without lower urinary tract symptoms: Secondary | ICD-10-CM | POA: Diagnosis not present

## 2022-08-30 DIAGNOSIS — Z125 Encounter for screening for malignant neoplasm of prostate: Secondary | ICD-10-CM | POA: Diagnosis not present

## 2022-08-30 DIAGNOSIS — H9190 Unspecified hearing loss, unspecified ear: Secondary | ICD-10-CM | POA: Diagnosis not present

## 2022-08-30 DIAGNOSIS — R972 Elevated prostate specific antigen [PSA]: Secondary | ICD-10-CM | POA: Diagnosis not present

## 2022-08-30 DIAGNOSIS — Z6831 Body mass index (BMI) 31.0-31.9, adult: Secondary | ICD-10-CM | POA: Diagnosis not present

## 2022-08-30 DIAGNOSIS — I10 Essential (primary) hypertension: Secondary | ICD-10-CM | POA: Diagnosis not present

## 2022-10-03 ENCOUNTER — Ambulatory Visit: Payer: Medicare Other | Admitting: Dermatology

## 2022-11-29 DIAGNOSIS — X32XXXA Exposure to sunlight, initial encounter: Secondary | ICD-10-CM | POA: Diagnosis not present

## 2022-11-29 DIAGNOSIS — L570 Actinic keratosis: Secondary | ICD-10-CM | POA: Diagnosis not present

## 2022-11-29 DIAGNOSIS — D225 Melanocytic nevi of trunk: Secondary | ICD-10-CM | POA: Diagnosis not present

## 2022-12-18 DIAGNOSIS — Z0000 Encounter for general adult medical examination without abnormal findings: Secondary | ICD-10-CM | POA: Diagnosis not present

## 2024-01-11 ENCOUNTER — Emergency Department (HOSPITAL_COMMUNITY)
Admission: EM | Admit: 2024-01-11 | Discharge: 2024-01-11 | Disposition: A | Discharge status: Home / Self Care | Payer: Medicare Other | Attending: Emergency Medicine | Admitting: Emergency Medicine

## 2024-01-11 ENCOUNTER — Other Ambulatory Visit: Payer: Self-pay

## 2024-01-11 DIAGNOSIS — E119 Type 2 diabetes mellitus without complications: Secondary | ICD-10-CM | POA: Diagnosis not present

## 2024-01-11 DIAGNOSIS — Z79899 Other long term (current) drug therapy: Secondary | ICD-10-CM | POA: Insufficient documentation

## 2024-01-11 DIAGNOSIS — I10 Essential (primary) hypertension: Secondary | ICD-10-CM | POA: Diagnosis not present

## 2024-01-11 DIAGNOSIS — U071 COVID-19: Secondary | ICD-10-CM | POA: Insufficient documentation

## 2024-01-11 DIAGNOSIS — R059 Cough, unspecified: Secondary | ICD-10-CM | POA: Diagnosis present

## 2024-01-11 LAB — RESP PANEL BY RT-PCR (RSV, FLU A&B, COVID)  RVPGX2
Influenza A by PCR: NEGATIVE
Influenza B by PCR: NEGATIVE
Resp Syncytial Virus by PCR: NEGATIVE
SARS Coronavirus 2 by RT PCR: POSITIVE — AB

## 2024-01-11 NOTE — ED Provider Notes (Signed)
 [facility]MENT AT [facility] Provider Note   CSN: [id number] Arrival date & time: 01/11/24  9093     History  Chief Complaint  Patient presents with   Generalized Body Aches    Michael Tapia is a 73 y.o. male.  Patient has a history of diabetes and hypertension.  He has had a cough and weakness for 6 days  The history is provided by the patient and medical records. No language interpreter was used.  Cough Cough characteristics:  Non-productive Sputum characteristics:  Nondescript Severity:  Moderate Onset quality:  Sudden Timing:  Constant Progression:  Waxing and waning Chronicity:  New Smoker: no   Context: not animal exposure   Associated symptoms: no chest pain, no eye discharge, no headaches and no rash        Home Medications Prior to Admission medications   Medication Sig Start Date End Date Taking? Authorizing Provider  celecoxib  (CELEBREX ) 200 MG capsule Take 1 capsule (200 mg total) by mouth 2 (two) times daily. 03/15/21   Danella Cough, PA-C  Cholecalciferol (VITAMIN D-3) 5000 units TABS Take 5,000 Units by mouth daily.    [provider]  Coenzyme Q10 100 MG capsule Take 100 mg by mouth daily.    [provider]  docusate sodium  (COLACE) 100 MG capsule Take 1 capsule (100 mg total) by mouth 2 (two) times daily. 03/15/21   Danella Cough, PA-C  Dutasteride -Tamsulosin  HCl 0.5-0.4 MG CAPS Take 1 capsule by mouth daily.    [provider]  ferrous sulfate  (FERROUSUL) 325 (65 FE) MG tablet Take 1 tablet (325 mg total) by mouth 3 (three) times daily with meals for 14 days. 03/15/21 03/29/21  Danella Cough, PA-C  Flaxseed, Linseed, (FLAXSEED OIL) 1400 MG CAPS Take 1,400 mg by mouth daily.    [provider]  FLAXSEED, LINSEED, PO Take 1 capsule by mouth daily.    [provider]  glucose blood (ONE TOUCH ULTRA TEST) test strip Use daily 02/05/14   Cyrena Gwenn SQUIBB, MD   HYDROcodone -acetaminophen  (NORCO) 7.5-325 MG tablet Take 1-2 tablets by mouth every 4 (four) hours as needed for moderate pain. 03/15/21   Danella Cough, PA-C  losartan  (COZAAR ) 25 MG tablet Take 25 mg by mouth daily.    [provider]  meloxicam (MOBIC) 7.5 MG tablet meloxicam 7.5 mg tablet    [provider]  methocarbamol  (ROBAXIN ) 500 MG tablet Take 1 tablet (500 mg total) by mouth every 6 (six) hours as needed for muscle spasms. 03/15/21   Danella Cough, PA-C  Misc Natural Products (GINSENG-COMPLEX PO) Take 1 capsule by mouth daily.    [provider]  Omega-3 Fatty Acids (FISH OIL) 1000 MG CAPS Take 1 capsule by mouth daily.    [provider]  omeprazole  (PRILOSEC) 20 MG capsule Take 1 capsule (20 mg total) by mouth daily. 02/05/14   Cyrena Gwenn SQUIBB, MD  [facility] DELICA LANCETS 33G MISC 1 each by Does not apply route every morning. 02/05/14   Cyrena Gwenn SQUIBB, MD  Pitavastatin Calcium  4 MG TABS Take 4 mg by mouth daily.    [provider]  polyethylene glycol (MIRALAX  / GLYCOLAX ) 17 g packet Take 17 g by mouth 2 (two) times daily. 03/15/21   Danella Cough, PA-C  sitaGLIPtin -metformin  (JANUMET ) 50-1000 MG per tablet TAKE 1 TABLET once a day Patient taking differently: Take 1 tablet by mouth daily. 02/05/14   Cyrena Gwenn SQUIBB, MD  tadalafil (CIALIS) 5 MG tablet Take  5 mg by mouth every morning.    [provider]  tamsulosin  (FLOMAX ) 0.4 MG CAPS capsule Take 0.4 mg by mouth daily. 10/12/21   [provider]  Turmeric 500 MG CAPS Take 500 mg by mouth daily.    [provider]  vitamin E 400 UNIT capsule Take 400 Units by mouth daily.    [provider]      Allergies    Bee venom, Ivp dye [iodinated contrast media], and Penicillins    Review of Systems   Review of Systems  Constitutional:  Negative for appetite change and fatigue.  HENT:  Negative for congestion, ear discharge and sinus pressure.   Eyes:   Negative for discharge.  Respiratory:  Positive for cough.   Cardiovascular:  Negative for chest pain.  Gastrointestinal:  Negative for abdominal pain and diarrhea.  Genitourinary:  Negative for frequency and hematuria.  Musculoskeletal:  Negative for back pain.  Skin:  Negative for rash.  Neurological:  Negative for seizures and headaches.  Psychiatric/Behavioral:  Negative for hallucinations.     Physical Exam Updated Vital Signs BP (!) 152/98   Pulse 81   Temp 97.7 F (36.5 C) (Oral)   Resp 17   Ht 5' 11 (1.803 m)   Wt 104.3 kg   SpO2 95%   BMI 32.08 kg/m  Physical Exam Nursing note reviewed.  Constitutional:      Appearance: He is well-developed.  HENT:     Head: Normocephalic.     Right Ear: External ear normal.  Eyes:     General: No scleral icterus.    Conjunctiva/sclera: Conjunctivae normal.  Neck:     Thyroid: No thyromegaly.  Cardiovascular:     Rate and Rhythm: Normal rate and regular rhythm.     Heart sounds: No murmur heard.    No friction rub. No gallop.  Pulmonary:     Breath sounds: No stridor. No wheezing or rales.  Chest:     Chest wall: No tenderness.  Abdominal:     General: There is no distension.     Tenderness: There is no abdominal tenderness. There is no rebound.  Musculoskeletal:        General: Normal range of motion.     Cervical back: Neck supple.  Lymphadenopathy:     Cervical: No cervical adenopathy.  Skin:    Findings: No erythema or rash.  Neurological:     Mental Status: He is oriented to person, place, and time.     Motor: No abnormal muscle tone.     Coordination: Coordination normal.  Psychiatric:        Behavior: Behavior normal.     ED Results / Procedures / Treatments   Labs (all labs ordered are listed, but only abnormal results are displayed) Labs Reviewed  RESP PANEL BY RT-PCR (RSV, FLU A&B, COVID)  RVPGX2 - Abnormal; Notable for the following components:      Result Value   SARS Coronavirus 2 by RT PCR  POSITIVE (*)    All other components within normal limits    EKG None  Radiology No results found.  Procedures Procedures    Medications Ordered in ED Medications - No data to display  ED Course/ Medical Decision Making/ A&P                                 Medical Decision Making  Patient with COVID-19.  He  is out of the window for Paxlovid.  He will be discharged home and take Tylenol  or Motrin and follow-up with his doctor as needed        Final Clinical Impression(s) / ED Diagnoses Final diagnoses:  COVID-19    Rx / DC Orders ED Discharge Orders     None         Suzette Pac, MD 01/13/24 1201

## 2024-01-11 NOTE — Discharge Instructions (Signed)
 Take Tylenol or Motrin and drink plenty of fluids.  Follow-up with your doctor if not improving

## 2024-01-11 NOTE — ED Triage Notes (Signed)
 Pt stated he took a covid home test and it was positive. Pt c/o all over body aches, decreased appetite and cough.

## 2024-04-22 ENCOUNTER — Emergency Department (HOSPITAL_COMMUNITY)

## 2024-04-22 ENCOUNTER — Other Ambulatory Visit: Payer: Self-pay

## 2024-04-22 ENCOUNTER — Encounter (HOSPITAL_COMMUNITY): Payer: Self-pay | Admitting: *Deleted

## 2024-04-22 ENCOUNTER — Emergency Department (HOSPITAL_COMMUNITY)
Admission: EM | Admit: 2024-04-22 | Discharge: 2024-04-23 | Disposition: A | Discharge status: Home / Self Care | Attending: Emergency Medicine | Admitting: Emergency Medicine

## 2024-04-22 DIAGNOSIS — R079 Chest pain, unspecified: Secondary | ICD-10-CM | POA: Insufficient documentation

## 2024-04-22 DIAGNOSIS — E119 Type 2 diabetes mellitus without complications: Secondary | ICD-10-CM | POA: Diagnosis not present

## 2024-04-22 DIAGNOSIS — Z7982 Long term (current) use of aspirin: Secondary | ICD-10-CM | POA: Diagnosis not present

## 2024-04-22 DIAGNOSIS — Z79899 Other long term (current) drug therapy: Secondary | ICD-10-CM | POA: Insufficient documentation

## 2024-04-22 DIAGNOSIS — I10 Essential (primary) hypertension: Secondary | ICD-10-CM | POA: Insufficient documentation

## 2024-04-22 LAB — COMPREHENSIVE METABOLIC PANEL WITH GFR
ALT: 23 U/L (ref 0–44)
AST: 25 U/L (ref 15–41)
Albumin: 4 g/dL (ref 3.5–5.0)
Alkaline Phosphatase: 43 U/L (ref 38–126)
Anion gap: 9 (ref 5–15)
BUN: 28 mg/dL — ABNORMAL HIGH (ref 8–23)
CO2: 24 mmol/L (ref 22–32)
Calcium: 9.4 mg/dL (ref 8.9–10.3)
Chloride: 107 mmol/L (ref 98–111)
Creatinine, Ser: 0.96 mg/dL (ref 0.61–1.24)
GFR, Estimated: >60 mL/min (ref >60)
Glucose, Bld: 127 mg/dL — ABNORMAL HIGH (ref 70–99)
Potassium: 3.7 mmol/L (ref 3.5–5.1)
Sodium: 140 mmol/L (ref 135–145)
Total Bilirubin: 0.8 mg/dL (ref 0.0–1.2)
Total Protein: 6.7 g/dL (ref 6.5–8.1)

## 2024-04-22 LAB — CBC WITH DIFFERENTIAL/PLATELET
Abs Immature Granulocytes: 0.03 10*3/uL (ref 0.00–0.07)
Basophils Absolute: 0.1 10*3/uL (ref 0.0–0.1)
Basophils Relative: 1 %
Eosinophils Absolute: 0.3 10*3/uL (ref 0.0–0.5)
Eosinophils Relative: 4 %
HCT: 42.6 % (ref 39.0–52.0)
Hemoglobin: 14.8 g/dL (ref 13.0–17.0)
Immature Granulocytes: 1 %
Lymphocytes Relative: 25 %
Lymphs Abs: 1.6 10*3/uL (ref 0.7–4.0)
MCH: 31.3 pg (ref 26.0–34.0)
MCHC: 34.7 g/dL (ref 30.0–36.0)
MCV: 90.1 fL (ref 80.0–100.0)
Monocytes Absolute: 0.4 10*3/uL (ref 0.1–1.0)
Monocytes Relative: 6 %
Neutro Abs: 4.1 10*3/uL (ref 1.7–7.7)
Neutrophils Relative %: 63 %
Platelets: 160 10*3/uL (ref 150–400)
RBC: 4.73 MIL/uL (ref 4.22–5.81)
RDW: 12.7 % (ref 11.5–15.5)
WBC: 6.4 10*3/uL (ref 4.0–10.5)
nRBC: 0 % (ref 0.0–0.2)

## 2024-04-22 LAB — TROPONIN I (HIGH SENSITIVITY): Troponin I (High Sensitivity): 13 ng/L (ref <18)

## 2024-04-22 NOTE — ED Triage Notes (Signed)
 Pt arrived with EMS from home for CP onset around noon, intermittent, sharp on the left side. 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 EMS gave 1 nitroglycerin and 324mg  asa without improvement

## 2024-04-22 NOTE — ED Provider Triage Note (Signed)
 Emergency Medicine Provider Triage Evaluation Note  Michael Tapia , a 73 y.o. male  was evaluated in triage.  Pt complains of cp.  Review of Systems  Positive: cp Negative: Ab pain, sob  Physical Exam  There were no vitals taken for this visit. Gen:   Awake, no distress   Resp:  Normal effort  MSK:   Moves extremities without difficulty  Other:    Medical Decision Making  Medically screening exam initiated at 10:25 PM.  Appropriate orders placed.  Jacarius Handel was informed that the remainder of the evaluation will be completed by another provider, this initial triage assessment does not replace that evaluation, and the importance of remaining in the ED until their evaluation is complete.     Lowery Rue, DO 04/22/24 2226

## 2024-04-23 DIAGNOSIS — R079 Chest pain, unspecified: Secondary | ICD-10-CM | POA: Diagnosis not present

## 2024-04-23 LAB — TROPONIN I (HIGH SENSITIVITY)
Troponin I (High Sensitivity): 12 ng/L (ref <18)
Troponin I (High Sensitivity): 8 ng/L (ref <18)

## 2024-04-23 NOTE — ED Provider Notes (Signed)
 [facility] Provider Note   CSN: [id number] Arrival date & time: 04/22/24  2224     History  Chief Complaint  Patient presents with   Chest Pain   HPI  Michael Tapia is a 73 y.o. male with past medical history hypertension, diabetes, hyperlipidemia presents due to chest pain.  Patient states after eating lunch yesterday, he began having sharp pain across his entire chest.  He took his blood pressure at the time, states it was 80/40.  He laid down to see if that would help.  Denies any radiation down his arms or to his jaw, did note some neck soreness but he thinks this is from the long wait in the waiting room.    He did not have any vomiting or diaphoresis.  No cough or fevers.  The chest pain has since significantly improved, and he feels sore at this time but no pain.  Denies any prior history of heart attacks.  States he had a stress test about 10 years ago that was negative, does not see a cardiologist.  No history of DVTs or PEs.  Of note, patient did receive aspirin  prior to arrival.  When he was seen here initially, his blood pressure was 190/51.   Chest Pain      Home Medications Prior to Admission medications   Medication Sig Start Date End Date Taking? Authorizing Provider  aspirin  81 MG chewable tablet Chew 81 mg by mouth daily.   Yes [provider]  Cholecalciferol (VITAMIN D-3) 5000 units TABS Take 5,000 Units by mouth daily.   Yes [provider]  Coenzyme Q10 100 MG capsule Take 100 mg by mouth daily.   Yes [provider]  Dutasteride -Tamsulosin  HCl 0.5-0.4 MG CAPS Take 1 capsule by mouth daily.   Yes [provider]  Flaxseed, Linseed, (FLAXSEED OIL) 1400 MG CAPS Take 1,400 mg by mouth daily.   Yes [provider]  losartan  (COZAAR ) 25 MG tablet Take 25 mg by mouth daily.   Yes [provider]  omeprazole  (PRILOSEC) 20 MG capsule Take 1 capsule (20 mg total) by  mouth daily. 02/05/14  Yes Suzzette Eth, MD  Pitavastatin Calcium  4 MG TABS Take 4 mg by mouth daily.   Yes [provider]  sitaGLIPtin -metformin  (JANUMET ) 50-1000 MG per tablet TAKE 1 TABLET once a day Patient taking differently: Take 1 tablet by mouth daily. 02/05/14  Yes Suzzette Eth, MD  tadalafil (CIALIS) 5 MG tablet Take 5 mg by mouth every morning.   Yes [provider]  tamsulosin  (FLOMAX ) 0.4 MG CAPS capsule Take 0.4 mg by mouth daily. 10/12/21  Yes [provider]  terbinafine (LAMISIL) 250 MG tablet Take 250 mg by mouth See admin instructions. :TAKE 1 TABLET BY MOUTH ONCE DAILY FOR 7 DAYS EACH---- 4th -11 of every month   Yes [provider]  Turmeric 500 MG CAPS Take 500 mg by mouth daily.   Yes [provider]  vitamin E 400 UNIT capsule Take 400 Units by mouth daily.   Yes [provider]      Allergies    Bee venom, Ivp dye [iodinated contrast media], Penicillins, and Statins    Review of Systems   Review of Systems  Cardiovascular:  Positive for chest pain.    Physical Exam Updated Vital Signs BP 139/72 (BP Location: Right Arm)   Pulse 62   Temp 97.7 F (36.5 C)   Resp 15  SpO2 95%  Physical Exam Vitals and nursing note reviewed.  Constitutional:      General: He is not in acute distress.    Appearance: He is well-developed.  HENT:     Head: Normocephalic and atraumatic.     Right Ear: External ear normal.     Left Ear: External ear normal.     Nose: Nose normal.     Mouth/Throat:     Mouth: Mucous membranes are moist.     Pharynx: No oropharyngeal exudate or posterior oropharyngeal erythema.  Eyes:     Conjunctiva/sclera: Conjunctivae normal.  Cardiovascular:     Rate and Rhythm: Normal rate and regular rhythm.     Pulses:          Radial pulses are 2+ on the right side and 2+ on the left side.     Heart sounds: No murmur heard. Pulmonary:     Effort: Pulmonary effort is normal. No respiratory  distress.     Breath sounds: Normal breath sounds.  Abdominal:     General: There is no distension.     Palpations: Abdomen is soft.     Tenderness: There is no abdominal tenderness. There is no guarding.  Musculoskeletal:        General: No swelling.     Cervical back: Neck supple.     Right lower leg: No edema.     Left lower leg: No edema.  Skin:    General: Skin is warm and dry.     Capillary Refill: Capillary refill takes less than 2 seconds.  Neurological:     Mental Status: He is alert.  Psychiatric:        Mood and Affect: Mood normal.     ED Results / Procedures / Treatments   Labs (all labs ordered are listed, but only abnormal results are displayed) Labs Reviewed  COMPREHENSIVE METABOLIC PANEL WITH GFR - Abnormal; Notable for the following components:      Result Value   Glucose, Bld 127 (*)    BUN 28 (*)    All other components within normal limits  CBC WITH DIFFERENTIAL/PLATELET  TROPONIN I (HIGH SENSITIVITY)  TROPONIN I (HIGH SENSITIVITY)  TROPONIN I (HIGH SENSITIVITY)    EKG None  Radiology DG Chest 2 View Result Date: 04/22/2024 CLINICAL DATA:  Pain EXAM: CHEST - 2 VIEW COMPARISON:  Chest x-ray 12/16/2023 FINDINGS: The heart size and mediastinal contours are within normal limits. Both lungs are clear. The visualized skeletal structures are unremarkable. IMPRESSION: No active cardiopulmonary disease. Electronically Signed   By: Tyron Gallon M.D.   On: 04/22/2024 23:25    Procedures Procedures    Medications Ordered in ED Medications - No data to display  ED Course/ Medical Decision Making/ A&P             HEART Score: 6                    Medical Decision Making  Patient is alert, afebrile, and hemodynamically stable in no acute distress.  Physical exam as noted above.  Differential includes ACS, PE, pneumonia, pneumothorax, CHF, costochondritis, amongst other diagnoses.  Of note, patient waited for quite some time in the waiting room for  about 15 hours.  He remained hemodynamically stable throughout this time with improvement in his blood pressure.  I personally interpreted patient's chest x-ray, which demonstrated no focal consolidations concerning for pneumonia.  I personally interpreted patient's EKG, which demonstrated sinus rhythm with rate  of 65, normal intervals, left anterior fascicular block with no acute ischemic changes.  Of note, patient had left intrafascicular block back in 2022.  Labs returned with unremarkable CBC and CMP.  Patient received a total of 3 troponins within 12 hours, all WNL.  On my assessment, patient feels back to baseline.  I considered dissection, which would be atypical given 2+ radial pulses, no tearing chest pain to the back, stable vital signs and resolution of pain.  I also considered PE, but patient has no unilateral leg swelling or shortness of breath, low risk per [place].  No lower extremity swelling, crackles on exam, new oxygen requirement or murmurs to suggest CHF.  Workup above was reassuring against pneumonia, pneumothorax, ACS, though patient is moderate risk for the latter given his heart score.  Will place urgent cardiology referral.  I spoke with patient and wife again about our assessment and reassuring workup.  We discussed the need for quick outpatient follow-up with cardiology given patient's risk factors, referral placed in AVS as well as contact information.  They are agreeable to this plan.  Strict return precautions were given, and patient was discharged in stable condition.        Final Clinical Impression(s) / ED Diagnoses Final diagnoses:  Chest pain, unspecified type    Rx / DC Orders ED Discharge Orders          Ordered    Ambulatory referral to Cardiology       Comments: If you have not heard from the Cardiology office within the next 72 hours please call [phone].   04/23/24 1730              Lorain Robson, MD 04/23/24 1749    Tegeler, Marine Sia, MD 04/23/24 [phone]

## 2024-04-23 NOTE — Discharge Instructions (Signed)
 You were seen in the ED today for chest pain.  Your workup did not show any emergency medical symptoms, but it is very important that you follow-up with a cardiologist in the next few days for reevaluation.  Please return to the ED if experience any recurrence of chest pain with shortness of breath, sweating, vomiting, or any other emergency medical symptoms.

## 2024-08-11 ENCOUNTER — Ambulatory Visit: Attending: Cardiovascular Disease | Admitting: Cardiovascular Disease

## 2024-08-12 ENCOUNTER — Encounter: Payer: Self-pay | Admitting: Cardiovascular Disease

## 2024-08-12 ENCOUNTER — Ambulatory Visit: Attending: Cardiovascular Disease | Admitting: Cardiovascular Disease

## 2024-08-12 VITALS — BP 138/62 | HR 68 | Ht 70.0 in | Wt 226.0 lb

## 2024-08-12 DIAGNOSIS — E785 Hyperlipidemia, unspecified: Secondary | ICD-10-CM

## 2024-08-12 DIAGNOSIS — R011 Cardiac murmur, unspecified: Secondary | ICD-10-CM | POA: Diagnosis not present

## 2024-08-12 DIAGNOSIS — R079 Chest pain, unspecified: Secondary | ICD-10-CM | POA: Diagnosis present

## 2024-08-12 DIAGNOSIS — I10 Essential (primary) hypertension: Secondary | ICD-10-CM | POA: Insufficient documentation

## 2024-08-12 NOTE — Progress Notes (Signed)
 Cardiology Office Note   Date:  08/12/2024   ID:  Michael Tapia, DOB 02-17-1951, MRN [id number]  PCP:  Okey Carlin Redbird, MD  Cardiologist:   Deatrice Cage, MD   Chief Complaint  Patient presents with   New Patient (Initial Visit)    Referral for Chest pain c/o elevated BP. Meds reviewed verbally with pt.      History of Present Illness: Michael Tapia is a 73 y.o. male who was referred from [facility] for evaluation of chest pain.  He has known history of essential hypertension, hyperlipidemia, GERD and arthritis.  He had previous knee replacement. He was seen in 2022 by Dr. Elmira for preoperative cardiovascular evaluation before knee surgery.  His functional capacity was felt to be good and he was deemed to be low risk for surgery with no need for further cardiac workup given that he was asymptomatic. He had previous cardiac testing in 2015 that included a treadmill nuclear stress test which showed possible inferior wall ischemia.  Echocardiogram showed normal LV systolic function with mildly dilated left atrium.  He had an emergency room visit in April for atypical chest pain.  He was hypertensive at that time with a blood pressure of 190/50.  Troponin was normal.  He had recent intermittent episode of chest pain that started in April of this year.  He describes tightness feeling that last for 5 to 10 minutes.  This was associated with labile blood pressure.  He had some intermittent episodes but not on a consistent basis.  He goes to the gym almost on a daily basis.  He describes no chest pain with exertion but reports significant exertional dyspnea. He is a lifelong non-smoker and does not drink alcohol.  Past Medical History:  Diagnosis Date   Arthritis    knees   BPH (benign prostatic hyperplasia)    Diabetes mellitus without complication (HCC)    Type II   GERD (gastroesophageal reflux disease)    History of hiatal hernia    Hyperlipidemia     Hypertension    Lumbar stenosis with neurogenic claudication    Metabolic syndrome     Past Surgical History:  Procedure Laterality Date   BACK SURGERY     EYE SURGERY     Bilateral Cataracts   LUMBAR LAMINECTOMY/DECOMPRESSION MICRODISCECTOMY Bilateral 10/28/2017   Procedure: Bilateral L3-4 L4-5 L5-S1 Laminotomy/Foraminotomy;  Surgeon: Colon Shove, MD;  Location: [facility] OR;  Service: Neurosurgery;  Laterality: Bilateral;  Bilateral L3-4 L4-5 L5-S1 Laminotomy/Foraminotomy   MANDIBLE FRACTURE SURGERY     MENISCUS REPAIR     Left   TONSILLECTOMY     TOTAL KNEE ARTHROPLASTY Right 03/14/2021   Procedure: TOTAL KNEE ARTHROPLASTY;  Surgeon: Ernie Cough, MD;  Location: [facility] ORS;  Service: Orthopedics;  Laterality: Right;  70 mins     Current Outpatient Medications  Medication Sig Dispense Refill   amLODipine (NORVASC) 2.5 MG tablet Take 2.5 mg by mouth daily as needed.     aspirin  81 MG chewable tablet Chew 81 mg by mouth daily.     Cholecalciferol (VITAMIN D-3) 5000 units TABS Take 5,000 Units by mouth daily.     Coenzyme Q10 100 MG capsule Take 100 mg by mouth daily.     Dutasteride -Tamsulosin  HCl 0.5-0.4 MG CAPS Take 1 capsule by mouth daily.     Flaxseed, Linseed, (FLAXSEED OIL) 1400 MG CAPS Take 1,400 mg by mouth daily.     omeprazole  (PRILOSEC) 20 MG capsule Take 1 capsule (20  mg total) by mouth daily. 90 capsule 3   Pitavastatin Calcium  4 MG TABS Take 4 mg by mouth daily.     sitaGLIPtin -metformin  (JANUMET ) 50-1000 MG per tablet TAKE 1 TABLET once a day 90 tablet 3   tadalafil (CIALIS) 5 MG tablet Take 5 mg by mouth every morning.     tamsulosin  (FLOMAX ) 0.4 MG CAPS capsule Take 0.4 mg by mouth daily.     terbinafine (LAMISIL) 250 MG tablet Take 250 mg by mouth See admin instructions. :TAKE 1 TABLET BY MOUTH ONCE DAILY FOR 7 DAYS EACH---- 4th -11 of every month     Turmeric 500 MG CAPS Take 500 mg by mouth daily.     vitamin E 400 UNIT capsule Take 400 Units by mouth daily.      losartan  (COZAAR ) 25 MG tablet Take 25 mg by mouth daily. (Patient not taking: Reported on 08/12/2024)     No current facility-administered medications for this visit.    Allergies:   Bee venom, Ivp dye [iodinated contrast media], Penicillins, and Statins    Social History:  The patient  reports that he quit smoking about 45 years ago. His smoking use included cigarettes. He has never used smokeless tobacco. He reports current alcohol use. He reports that he does not use drugs.   Family History:  The patient's family history includes COPD in his mother; Heart Problems in his mother; Stroke in his father.    ROS:  Please see the history of present illness.   Otherwise, review of systems are positive for none.   All other systems are reviewed and negative.    PHYSICAL EXAM: VS:  BP 138/62 (BP Location: Right Arm, Patient Position: Sitting, Cuff Size: Normal)   Pulse 68   Ht 5' 10 (1.778 m)   Wt 226 lb (102.5 kg)   SpO2 98%   BMI 32.43 kg/m  , BMI Body mass index is 32.43 kg/m. GEN: Well nourished, well developed, in no acute distress  HEENT: normal  Neck: no JVD, carotid bruits, or masses Cardiac: RRR; no  rubs, or gallops,no edema .  2 out of 6 systolic murmur in the aortic area which is early peaking. Respiratory:  clear to auscultation bilaterally, normal work of breathing GI: soft, nontender, nondistended, + BS MS: no deformity or atrophy  Skin: warm and dry, no rash Neuro:  Strength and sensation are intact Psych: euthymic mood, full affect   EKG:  EKG is ordered today. The ekg ordered today demonstrates : Normal sinus rhythm Left axis deviation Pulmonary disease pattern When compared with ECG of 22-Apr-2024 22:34, No significant change was found    Recent Labs: 04/22/2024: ALT 23; BUN 28; Creatinine, Ser 0.96; Hemoglobin 14.8; Platelets 160; Potassium 3.7; Sodium 140    Lipid Panel    Component Value Date/Time   CHOL 123 05/03/2014 1138   CHOL 143 06/16/2013  1245   TRIG 95 05/03/2014 1138   TRIG 110 06/16/2013 1245   HDL 46 05/03/2014 1138   HDL 41 06/16/2013 1245   LDLCALC 58 05/03/2014 1138   LDLCALC 80 06/16/2013 1245      Wt Readings from Last 3 Encounters:  08/12/24 226 lb (102.5 kg)  01/11/24 230 lb (104.3 kg)  03/14/21 230 lb (104.3 kg)          08/12/2024    2:37 PM  PAD Screen  Previous PAD dx? No  Previous surgical procedure? No  Pain with walking? No  Feet/toe relief with dangling? No  Painful, non-healing ulcers? No  Extremities discolored? No      ASSESSMENT AND PLAN:  1.  Atypical chest pain with multiple risk factors for coronary artery disease: This is associated with significant exertional dyspnea.  I recommend evaluation with a treadmill Myoview .  His baseline EKG is abnormal.  I considered cardiac CTA but he is highly allergic to contrast with previous anaphylaxis.  2.  Cardiac murmur: Suggestive of aortic sclerosis.  Given associated dyspnea, I requested an echocardiogram.  3.  Hyperlipidemia: He reports intolerance to statins but has been doing well with Livalo.  4.  Essential hypertension: He reports that losartan  made his blood pressure worse and he did not tolerate the medication.  He is currently not on any antihypertensive medications and his blood pressure seems to be controlled.  He has been checking his blood pressure at home and average reading is 138/60 6 over 41-month.    Disposition:   FU to be determined based on the results of cardiac workup.  Signed,  Deatrice Cage, MD  08/12/2024 2:50 PM    [facility]

## 2024-08-12 NOTE — Patient Instructions (Addendum)
 Medication Instructions:  No changes *If you need a refill on your cardiac medications before your next appointment, please call your pharmacy*  Lab Work: None ordered If you have labs (blood work) drawn today and your tests are completely normal, you will receive your results only by: MyChart Message (if you have MyChart) OR A paper copy in the mail If you have any lab test that is abnormal or we need to change your treatment, we will call you to review the results.  Testing/Procedures: Your physician has requested that you have an echocardiogram. Echocardiography is a painless test that uses sound waves to create images of your heart. It provides your doctor with information about the size and shape of your heart and how well your heart's chambers and valves are working. You may receive an ultrasound enhancing agent through an IV if needed to better visualize your heart during the echo.This procedure takes approximately one hour. There are no restrictions for this procedure.  This will take place at [address] [facility], 2nd floor  Please note: We ask at that you not bring children with you during ultrasound (echo/ vascular) testing. Due to room size and safety concerns, children are not allowed in the ultrasound rooms during exams. Our front office staff cannot provide observation of children in our lobby area while testing is being conducted. An adult accompanying a patient to their appointment will only be allowed in the ultrasound room at the discretion of the ultrasound technician under special circumstances. We apologize for any inconvenience.   Please note: We ask at that you not bring children with you during ultrasound (echo/ vascular) testing. Due to room size and safety concerns, children are not allowed in the ultrasound rooms during exams. Our front office staff cannot provide observation of children in our lobby area while testing is being conducted. An adult accompanying a patient to  their appointment will only be allowed in the ultrasound room at the discretion of the ultrasound technician under special circumstances. We apologize for any inconvenience.   Follow-Up: Follow up pending results  We recommend signing up for the patient portal called MyChart.  Sign up information is provided on this After Visit Summary.  MyChart is used to connect with patients for Virtual Visits (Telemedicine).  Patients are able to view lab/test results, encounter notes, upcoming appointments, etc.  Non-urgent messages can be sent to your provider as well.   To learn more about what you can do with MyChart, go to [email or web address].   Other Instructions Your physician has requested that you have an exercise stress myoview . For further information please visit [email or web address] Please follow instruction sheet, as given. This will take place at [address] [facility], 2nd floor  How to prepare for your Myocardial Perfusion Test: Do not eat or drink 3 hours prior to your test, except you may have water . Do not consume products containing caffeine (regular or decaffeinated) 12 hours prior to your test. (ex: coffee, chocolate, sodas, tea). Do bring a list of your current medications with you.  If not listed below, you may take your medications as normal. Do wear comfortable clothes (no dresses or overalls) and walking shoes, tennis shoes preferred (No heels or open toe shoes are allowed). Do NOT wear cologne, perfume, aftershave, or lotions (deodorant is allowed). The test will take approximately 3 to 4 hours to complete If these instructions are not followed, your test will have to be rescheduled.

## 2024-08-13 ENCOUNTER — Telehealth (HOSPITAL_COMMUNITY): Payer: Self-pay | Admitting: *Deleted

## 2024-08-13 NOTE — Telephone Encounter (Signed)
 Pt given instructions for MPI study.

## 2024-08-14 ENCOUNTER — Other Ambulatory Visit: Payer: Self-pay | Admitting: Cardiovascular Disease

## 2024-08-14 DIAGNOSIS — R079 Chest pain, unspecified: Secondary | ICD-10-CM

## 2024-08-17 ENCOUNTER — Ambulatory Visit: Payer: Self-pay | Admitting: Cardiovascular Disease

## 2024-08-17 ENCOUNTER — Ambulatory Visit (HOSPITAL_COMMUNITY)
Admission: RE | Admit: 2024-08-17 | Discharge: 2024-08-17 | Disposition: A | Discharge status: Home / Self Care | Source: Ambulatory Visit | Attending: Cardiovascular Disease | Admitting: Cardiovascular Disease

## 2024-08-17 DIAGNOSIS — R079 Chest pain, unspecified: Secondary | ICD-10-CM | POA: Diagnosis present

## 2024-08-17 LAB — MYOCARDIAL PERFUSION IMAGING
Angina Index: 0
Duke Treadmill Score: 4
Estimated workload: 5.9
Exercise duration (min): 4 min
Exercise duration (sec): 5 s
LV dias vol: 136 mL (ref 62–150)
LV sys vol: 58 mL (ref 4.2–5.8)
MPHR: 144 {beats}/min
Nuc Stress EF: 57 %
Peak HR: 134 {beats}/min
Percent HR: 93 %
RPE: 19
Rest HR: 63 {beats}/min
Rest Nuclear Isotope Dose: 10.9 mCi
SDS: 0
SRS: 0
SSS: 0
ST Depression (mm): 0 mm
Stress Nuclear Isotope Dose: 31.3 mCi
TID: 1.1

## 2024-08-17 MED ORDER — TECHNETIUM TC 99M TETROFOSMIN IV KIT
10.9000 | PACK | Freq: Once | INTRAVENOUS | Status: AC | PRN
  Administered 2024-08-17: 10.9 via INTRAVENOUS

## 2024-08-17 MED ORDER — TECHNETIUM TC 99M TETROFOSMIN IV KIT
31.3000 | PACK | Freq: Once | INTRAVENOUS | Status: AC | PRN
  Administered 2024-08-17: 31.3 via INTRAVENOUS

## 2024-08-21 ENCOUNTER — Ambulatory Visit: Payer: Self-pay | Admitting: Cardiovascular Disease

## 2024-11-11 ENCOUNTER — Ambulatory Visit (HOSPITAL_COMMUNITY)
Admission: RE | Admit: 2024-11-11 | Discharge: 2024-11-11 | Disposition: A | Discharge status: Home / Self Care | Source: Ambulatory Visit | Attending: Internal Medicine | Admitting: Internal Medicine

## 2024-11-11 DIAGNOSIS — I3481 Nonrheumatic mitral (valve) annulus calcification: Secondary | ICD-10-CM

## 2024-11-11 DIAGNOSIS — R011 Cardiac murmur, unspecified: Secondary | ICD-10-CM | POA: Diagnosis not present

## 2024-11-11 LAB — ECHOCARDIOGRAM COMPLETE
AR max vel: 1.8 cm2
AV Area VTI: 1.83 cm2
AV Area mean vel: 1.87 cm2
AV Mean grad: 10 mmHg
AV Peak grad: 19.4 mmHg
Ao pk vel: 2.2 m/s
Area-P 1/2: 3.02 cm2
S' Lateral: 3.7 cm

## 2024-11-17 ENCOUNTER — Encounter: Payer: Self-pay | Admitting: Cardiovascular Disease

## 2024-11-17 ENCOUNTER — Ambulatory Visit: Attending: Cardiovascular Disease | Admitting: Cardiovascular Disease

## 2024-11-17 VITALS — BP 130/60 | HR 86 | Ht 70.0 in | Wt 228.2 lb

## 2024-11-17 DIAGNOSIS — E785 Hyperlipidemia, unspecified: Secondary | ICD-10-CM | POA: Insufficient documentation

## 2024-11-17 DIAGNOSIS — I2510 Atherosclerotic heart disease of native coronary artery without angina pectoris: Secondary | ICD-10-CM | POA: Diagnosis present

## 2024-11-17 DIAGNOSIS — I350 Nonrheumatic aortic (valve) stenosis: Secondary | ICD-10-CM | POA: Diagnosis present

## 2024-11-17 DIAGNOSIS — I10 Essential (primary) hypertension: Secondary | ICD-10-CM | POA: Insufficient documentation

## 2024-11-17 NOTE — Patient Instructions (Signed)

## 2024-11-17 NOTE — Progress Notes (Signed)
 Cardiology Office Note   Date:  11/17/2024   ID:  Michael Tapia, DOB 08-Jun-1951, MRN [id number]  PCP:  Okey Carlin Redbird, MD  Cardiologist:   Deatrice Cage, MD   Chief Complaint  Patient presents with   Follow-up    F/u echo c/o episode of elevated BP and took 1 does of losartan  only. Meds reviewed verbally with pt.      History of Present Illness: Michael Tapia is a 73 y.o. male who is here today for follow-up visit regarding coronary artery calcifications and aortic stenosis.   He has known history of essential hypertension, hyperlipidemia, GERD and arthritis.  He had previous knee replacement. He had previous cardiac testing in 2015 that included a treadmill nuclear stress test which showed possible inferior wall ischemia.  Echocardiogram showed normal LV systolic function with mildly dilated left atrium.  He had an emergency room visit in April for atypical chest pain.  He was hypertensive at that time with a blood pressure of 190/50.  Troponin was normal. He underwent a Lexiscan Myoview  in August which showed no evidence of ischemia with normal ejection fraction.  He was noted to have mild coronary artery calcifications.  He had an echocardiogram done recently which showed normal LV systolic function with evidence of mild aortic stenosis.  He is doing well overall with no recurrent chest pain.  Blood pressure has been controlled.   Past Medical History:  Diagnosis Date   Arthritis    knees   BPH (benign prostatic hyperplasia)    Diabetes mellitus without complication (HCC)    Type II   GERD (gastroesophageal reflux disease)    History of hiatal hernia    Hyperlipidemia    Hypertension    Lumbar stenosis with neurogenic claudication    Metabolic syndrome     Past Surgical History:  Procedure Laterality Date   BACK SURGERY     EYE SURGERY     Bilateral Cataracts   LUMBAR LAMINECTOMY/DECOMPRESSION MICRODISCECTOMY Bilateral 10/28/2017   Procedure: Bilateral  L3-4 L4-5 L5-S1 Laminotomy/Foraminotomy;  Surgeon: Colon Shove, MD;  Location: [facility] OR;  Service: Neurosurgery;  Laterality: Bilateral;  Bilateral L3-4 L4-5 L5-S1 Laminotomy/Foraminotomy   MANDIBLE FRACTURE SURGERY     MENISCUS REPAIR     Left   TONSILLECTOMY     TOTAL KNEE ARTHROPLASTY Right 03/14/2021   Procedure: TOTAL KNEE ARTHROPLASTY;  Surgeon: Ernie Cough, MD;  Location: [facility];  Service: Orthopedics;  Laterality: Right;  70 mins     Current Outpatient Medications  Medication Sig Dispense Refill   amLODipine (NORVASC) 2.5 MG tablet Take 2.5 mg by mouth daily as needed.     aspirin  81 MG chewable tablet Chew 81 mg by mouth daily.     Cholecalciferol (VITAMIN D-3) 5000 units TABS Take 5,000 Units by mouth daily.     Coenzyme Q10 100 MG capsule Take 100 mg by mouth daily.     Dutasteride -Tamsulosin  HCl 0.5-0.4 MG CAPS Take 1 capsule by mouth daily.     Flaxseed, Linseed, (FLAXSEED OIL) 1400 MG CAPS Take 1,400 mg by mouth daily.     losartan  (COZAAR ) 25 MG tablet Take 25 mg by mouth daily. (Patient not taking: Reported on 08/12/2024)     omeprazole  (PRILOSEC) 20 MG capsule Take 1 capsule (20 mg total) by mouth daily. 90 capsule 3   Pitavastatin Calcium  4 MG TABS Take 4 mg by mouth daily.     sitaGLIPtin -metformin  (JANUMET ) 50-1000 MG per tablet TAKE 1 TABLET once a  day 90 tablet 3   tadalafil (CIALIS) 5 MG tablet Take 5 mg by mouth every morning.     tamsulosin  (FLOMAX ) 0.4 MG CAPS capsule Take 0.4 mg by mouth daily.     terbinafine (LAMISIL) 250 MG tablet Take 250 mg by mouth See admin instructions. :TAKE 1 TABLET BY MOUTH ONCE DAILY FOR 7 DAYS EACH---- 4th -11 of every month (Patient not taking: Reported on 11/17/2024)     Turmeric 500 MG CAPS Take 500 mg by mouth daily.     vitamin E 400 UNIT capsule Take 400 Units by mouth daily.     No current facility-administered medications for this visit.    Allergies:   Bee venom, Ivp dye [iodinated contrast media], Penicillins, and  Statins    Social History:  The patient  reports that he quit smoking about 45 years ago. His smoking use included cigarettes. He has never used smokeless tobacco. He reports current alcohol use. He reports that he does not use drugs.   Family History:  The patient's family history includes COPD in his mother; Heart Problems in his mother; Stroke in his father.    ROS:  Please see the history of present illness.   Otherwise, review of systems are positive for none.   All other systems are reviewed and negative.    PHYSICAL EXAM: VS:  BP 130/60 (BP Location: Left Arm, Patient Position: Sitting, Cuff Size: Normal)   Pulse 86   Ht 5' 10 (1.778 m)   Wt 228 lb 4 oz (103.5 kg)   SpO2 96%   BMI 32.75 kg/m  , BMI Body mass index is 32.75 kg/m. GEN: Well nourished, well developed, in no acute distress  HEENT: normal  Neck: no JVD, carotid bruits, or masses Cardiac: RRR; no  rubs, or gallops,no edema .  2 out of 6 systolic murmur in the aortic area which is early peaking. Respiratory:  clear to auscultation bilaterally, normal work of breathing GI: soft, nontender, nondistended, + BS MS: no deformity or atrophy  Skin: warm and dry, no rash Neuro:  Strength and sensation are intact Psych: euthymic mood, full affect   EKG:  EKG is ordered today. The ekg ordered today demonstrates : Normal sinus rhythm Left axis deviation Pulmonary disease pattern When compared with ECG of 22-Apr-2024 22:34, No significant change was found    Recent Labs: 04/22/2024: ALT 23; BUN 28; Creatinine, Ser 0.96; Hemoglobin 14.8; Platelets 160; Potassium 3.7; Sodium 140    Lipid Panel    Component Value Date/Time   CHOL 123 05/03/2014 1138   CHOL 143 06/16/2013 1245   TRIG 95 05/03/2014 1138   TRIG 110 06/16/2013 1245   HDL 46 05/03/2014 1138   HDL 41 06/16/2013 1245   LDLCALC 58 05/03/2014 1138   LDLCALC 80 06/16/2013 1245      Wt Readings from Last 3 Encounters:  11/17/24 228 lb 4 oz (103.5  kg)  08/12/24 226 lb (102.5 kg)  01/11/24 230 lb (104.3 kg)          08/12/2024    2:37 PM  PAD Screen  Previous PAD dx? No  Previous surgical procedure? No  Pain with walking? No  Feet/toe relief with dangling? No  Painful, non-healing ulcers? No  Extremities discolored? No      ASSESSMENT AND PLAN:  1.  Coronary artery calcifications: Nuclear stress test showed no evidence of ischemia but he was noted to have mild coronary artery calcifications.  No recurrent chest pain.  Continue treatment of risk factors.  2.  Aortic stenosis: This was mild on recent echocardiogram with plans to repeat echocardiogram in 1 to 2 years.  I discussed with him the natural history of aortic stenosis.  3.  Hyperlipidemia: He reports intolerance to statins but has been doing well with Livalo.  Recommend a target LDL of less than 70.  4.  Essential hypertension: His blood pressure seems to be controlled without the need for medications most of the time.  He does have losartan  and amlodipine to be used as needed.  I suspect that he will have to be on a regularly scheduled medication instead of as needed.  He is going to establish with a new primary care physician.    Disposition:   FU in 1 year.  Signed,  Deatrice Cage, MD  11/17/2024 1:56 PM    [facility]
